# Patient Record
Sex: Male | Born: 1950 | Race: White | Hispanic: No | Marital: Married | Smoking: Former smoker
Health system: Southern US, Community
[De-identification: ages and names within clinical notes are randomized; demographics above are authoritative.]

## PROBLEM LIST (undated history)

## (undated) DIAGNOSIS — N289 Disorder of kidney and ureter, unspecified: Secondary | ICD-10-CM

## (undated) DIAGNOSIS — I82409 Acute embolism and thrombosis of unspecified deep veins of unspecified lower extremity: Secondary | ICD-10-CM

## (undated) DIAGNOSIS — I10 Essential (primary) hypertension: Secondary | ICD-10-CM

## (undated) DIAGNOSIS — C61 Malignant neoplasm of prostate: Secondary | ICD-10-CM

## (undated) DIAGNOSIS — E785 Hyperlipidemia, unspecified: Secondary | ICD-10-CM

## (undated) DIAGNOSIS — M4800 Spinal stenosis, site unspecified: Secondary | ICD-10-CM

## (undated) HISTORY — DX: Essential (primary) hypertension: I10

## (undated) HISTORY — PX: RENAL BIOPSY: SHX156

## (undated) HISTORY — DX: Hyperlipidemia, unspecified: E78.5

## (undated) HISTORY — DX: Acute embolism and thrombosis of unspecified deep veins of unspecified lower extremity: I82.409

## (undated) HISTORY — PX: TONSILLECTOMY: SUR1361

## (undated) HISTORY — DX: Disorder of kidney and ureter, unspecified: N28.9

## (undated) HISTORY — DX: Spinal stenosis, site unspecified: M48.00

---
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
## 2005-05-27 ENCOUNTER — Encounter: Admission: RE | Admit: 2005-05-27 | Discharge: 2005-05-27 | Payer: Self-pay | Admitting: Nephrology

## 2005-05-29 ENCOUNTER — Ambulatory Visit (HOSPITAL_COMMUNITY): Admission: RE | Admit: 2005-05-29 | Discharge: 2005-05-30 | Payer: Self-pay | Admitting: Nephrology

## 2006-07-28 ENCOUNTER — Ambulatory Visit: Payer: Self-pay | Admitting: Family Medicine

## 2006-08-08 ENCOUNTER — Emergency Department (HOSPITAL_COMMUNITY): Admission: EM | Admit: 2006-08-08 | Discharge: 2006-08-08 | Payer: Self-pay | Admitting: Emergency Medicine

## 2006-08-11 ENCOUNTER — Ambulatory Visit: Payer: Self-pay | Admitting: Family Medicine

## 2006-09-22 ENCOUNTER — Ambulatory Visit: Payer: Self-pay | Admitting: Pulmonary Disease

## 2006-10-01 ENCOUNTER — Ambulatory Visit (HOSPITAL_COMMUNITY): Admission: RE | Admit: 2006-10-01 | Discharge: 2006-10-01 | Payer: Self-pay | Admitting: Pulmonary Disease

## 2006-10-20 ENCOUNTER — Ambulatory Visit: Payer: Self-pay | Admitting: Pulmonary Disease

## 2006-11-26 ENCOUNTER — Ambulatory Visit: Payer: Self-pay | Admitting: Pulmonary Disease

## 2007-01-25 ENCOUNTER — Ambulatory Visit: Payer: Self-pay | Admitting: Family Medicine

## 2007-01-25 DIAGNOSIS — I129 Hypertensive chronic kidney disease with stage 1 through stage 4 chronic kidney disease, or unspecified chronic kidney disease: Secondary | ICD-10-CM | POA: Insufficient documentation

## 2007-01-25 DIAGNOSIS — E785 Hyperlipidemia, unspecified: Secondary | ICD-10-CM | POA: Insufficient documentation

## 2007-01-25 DIAGNOSIS — I10 Essential (primary) hypertension: Secondary | ICD-10-CM | POA: Insufficient documentation

## 2007-01-25 DIAGNOSIS — J45909 Unspecified asthma, uncomplicated: Secondary | ICD-10-CM | POA: Insufficient documentation

## 2007-01-25 DIAGNOSIS — J019 Acute sinusitis, unspecified: Secondary | ICD-10-CM | POA: Insufficient documentation

## 2007-02-28 ENCOUNTER — Encounter (INDEPENDENT_AMBULATORY_CARE_PROVIDER_SITE_OTHER): Payer: Self-pay | Admitting: Family Medicine

## 2007-10-10 ENCOUNTER — Encounter (INDEPENDENT_AMBULATORY_CARE_PROVIDER_SITE_OTHER): Payer: Self-pay | Admitting: Family Medicine

## 2008-05-31 ENCOUNTER — Ambulatory Visit: Payer: Self-pay | Admitting: Pulmonary Disease

## 2008-10-10 ENCOUNTER — Telehealth (INDEPENDENT_AMBULATORY_CARE_PROVIDER_SITE_OTHER): Payer: Self-pay | Admitting: *Deleted

## 2009-02-13 ENCOUNTER — Encounter: Payer: Self-pay | Admitting: Internal Medicine

## 2009-02-15 ENCOUNTER — Encounter: Payer: Self-pay | Admitting: Internal Medicine

## 2009-03-06 ENCOUNTER — Telehealth: Payer: Self-pay | Admitting: Family Medicine

## 2009-08-13 ENCOUNTER — Ambulatory Visit: Payer: Self-pay | Admitting: Pulmonary Disease

## 2010-08-06 ENCOUNTER — Encounter: Payer: Self-pay | Admitting: Internal Medicine

## 2010-09-02 ENCOUNTER — Encounter: Payer: Self-pay | Admitting: Internal Medicine

## 2010-09-12 ENCOUNTER — Telehealth: Payer: Self-pay | Admitting: Internal Medicine

## 2010-09-25 NOTE — Progress Notes (Signed)
Summary: Needs to establish with Hop and Declined  Phone Note Outgoing Call   Summary of Call: MD has been receiving records on the patient and has not seen him. Note from 2010 states that the patient said he will be seeing [facility]. Patient needs appt to est/CPX. Left message on machine to call back to office. Lucious Groves CMA  September 12, 2010 11:17 AM   No return call, so I called the patient again. I spoke with him and patient declined appt at this time. Lucious Groves CMA  September 16, 2010 3:35 PM   Follow-up for Phone Call        noted Follow-up by: Marga Melnick MD,  September 16, 2010 4:18 PM

## 2010-10-01 NOTE — Letter (Signed)
Summary: [facility]  [facility]   Imported By: Lanelle Bal 09/22/2010 12:41:32  _____________________________________________________________________  External Attachment:    Type:   Image     Comment:   External Document

## 2011-01-09 NOTE — Assessment & Plan Note (Signed)
[facility]                             PULMONARY OFFICE NOTE   IGOR, BISHOP                     MRN:          [id number]  DATE:10/20/2006                            DOB:          [birth year]    SUBJECTIVE:  Mr. Tomasello comes in today for followup of his recent  methacholine challenge test.  This showed a definite hyperreactive  airway response to methacholine with significant improvement after beta-  2 agonist administration.  The patient has been using albuterol a lot  lately with increasing cough and also wheezing; also increased shortness  of breath.   PHYSICAL EXAMINATION:  VITAL SIGNS:  BP is 112/82, pulse is 70,  temperature is 98.1, weight is 178 pounds, O2 saturation on room air is  94%.  GENERAL:  He is a well-developed male in no acute distress.  CHEST:  Reveals decreased breath sounds throughout.  CARDIAC:  Reveals regular rate and rhythm.   IMPRESSION:  Positive methacholine challenge test with symptoms  consistent with mild asthma exacerbation.  At this point in time I think  he needs to have a prednisone taper to decrease airway inflammation and  also needs to be on maintenance inhaled corticosteroids on a regular  basis.  I have had a long discussion with him about the inflammatory  response of asthma and how medication needs to be taken on a daily basis  lifelong.  The patient has voiced understanding of this.   PLAN:  1. Will place on prednisone taper over the next 6 days.  2. Asmanex two puffs q.h.s.  The patient can also use albumin p.r.n.      but I have told him albuterol used more than two times a week is      considered a red flag for poor asthma control.  3. Tussionex 4 ounces 5mL q.12h. p.r.n. for his cough until the      steroids kick in to decrease airway inflammation.  If the patient      continues to have a cough, I think we need to consider whether or      not Lotrel is contributing to this.  4. The patient  will follow up in 4 weeks, sooner if there are      problems.     Barbaraann Share, MD,FCCP  Electronically Signed    [facility]  DD: 10/28/2006  DT: 10/28/2006  Job #: [id number]   cc:   Terrial Rhodes, M.D.

## 2011-01-09 NOTE — Assessment & Plan Note (Signed)
[facility]                             PULMONARY OFFICE NOTE   TERRYON, PINEIRO                     MRN:          [id number]  DATE:09/22/2006                            DOB:          February 10, 1951    HISTORY OF PRESENT ILLNESS:  The patient is a 60 year old gentleman who  is being referred today for shortness of breath and wheezing.  The  patient states that in November/December of this year he began to have  spells of wheezing at night.  This kept him from sleeping at times.  He was seen by his primary care doctor and given various medications,  including albuterol and prednisone.  He felt a little bit better after  this.  On December 16 he had a very bad episode where he had to go to  the emergency room and he received prednisone by mouth as well as IV and  he felt much better with this.  The patient has not had any prednisone  since the first week of January, but has used the albuterol about 3  times over the last 2 weeks.  He was also placed on Flovent and has not  been taken this.  Overall he feels that he is somewhat better.  He notes  shortness of breath with mild to moderate exertion as well as a cough  with no mucous production.  The patient has never had a allergy  evaluation and has no history of childhood asthma or family history for  asthma.  The patient describes the spells as a feeling in his chest  that it is closely down, and does not believe that it is in his throat  area.  The patient does have a history of being on Lotrel for years and  had increasing dose approximately one year ago.   PAST MEDICAL HISTORY:  1. Chronic renal insufficiency/nephropathy.  2. History of hypertension.   CURRENT MEDICATIONS:  1. Lotrel 10/40 one daily.  2. Lovaza 1 gram 3 daily.  3. Simvastatin 40 mg daily.  4. Hydrochlorothiazide 25 mg daily.  5. Flovent 44 p.r.n.  6. Albuterol HFA p.r.n.   ALLERGIES:  No known drug allergies.   SOCIAL  HISTORY:  The patient works in Air[employer] and is married.  He has a  history of smoking one half pack per day for 15 years.  He has smoked on  and off since high school.  He has been quit since 3 years ago.   FAMILY HISTORY:  Unremarkable.   REVIEW OF SYSTEMS:  As per history of present illness.  Also see patient  intake form documented in chart.   PHYSICAL EXAMINATION:  GENERAL:  He is a well-developed male in no acute  distress.  Blood pressure is 118/74, pulse is 67, temperature is 98, weight 179  pounds, O2 saturation on room air is 95%.  HEENT:  Pupils equal, round and reactive to light and accommodation.  Extraocular muscles are intact.  Nares are patent without discharge.  Oropharynx is clear.  NECK:  Supple without JVD or lymphadenopathy.  There is no palpable  thyromegaly.  CHEST:  Totally clear to auscultation.  CARDIAC:  Regular rate and rhythm.  No murmurs, rubs or gallops.  ABDOMEN:  Soft, nontender with good bowel sounds.  GENITAL, RECTAL, BREAST:  Not done and not indicated.  LOWER EXTREMITIES:  Without edema.  Pulses are intact distally.  NEUROLOGIC:  Alert and oriented with no obvious motor deficits.   LABORATORY DATA:  Spirometry done today in the office shows no airflow  obstruction by FEV1 or FVC; however, his flow volume loop does have mild  coving that may be suggestive of occult airflow obstruction.   IMPRESSION:  Episodic shortness of breath with wheezing of unknown  etiology.  Although the patient does not have definite airflow  obstruction on his pulmonary function tests, his flow volume loop may  suggest this and his history is somewhat suspicious for this.  I really  think that at this point in time that we need to put the issue to rest  and have him undergo methacholine challenge.  The patient is a  _agreeable_________ approach.   PLAN:  1. Schedule for methacholine challenge test.  2. The patient will follow up after the above.     Barbaraann Share,  MD,FCCP  Electronically Signed    [facility]  DD: 10/18/2006  DT: 10/19/2006  Job #: [id number]   cc:   Lottie Rater, M.D.

## 2011-04-15 ENCOUNTER — Encounter: Payer: Self-pay | Admitting: Internal Medicine

## 2011-05-04 ENCOUNTER — Ambulatory Visit: Payer: Self-pay | Admitting: Internal Medicine

## 2011-05-25 ENCOUNTER — Ambulatory Visit (INDEPENDENT_AMBULATORY_CARE_PROVIDER_SITE_OTHER): Payer: BC Managed Care – PPO | Admitting: Internal Medicine

## 2011-05-25 ENCOUNTER — Encounter: Payer: Self-pay | Admitting: Internal Medicine

## 2011-05-25 VITALS — BP 124/80 | HR 80 | Ht 70.0 in | Wt 191.0 lb

## 2011-05-25 DIAGNOSIS — R195 Other fecal abnormalities: Secondary | ICD-10-CM

## 2011-05-25 NOTE — Patient Instructions (Signed)
Please begin Benefiber daily for 1 month.  Follow up in 4 weeks.

## 2011-05-25 NOTE — Progress Notes (Signed)
Subjective:    Patient ID: Shawn Cooley, male    DOB: 1950/10/19, 60 y.o.   MRN: [id number]  HPI MR. 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 Arrie Aran for evaluation of loose stools/diarrhea.  The patient reports 2-3 months of loose stools. This started somewhat abruptly and represents a change for him. Prior to this he was having one formed stool daily. For the last 2 months he reports "runny" or loose stools one to 2 times a day. Occasionally there has been urgency associated with these loose stools. Overall for the past one to 2 weeks he feels he is somewhat better. He continues to have mainly one to 2 loose stools daily. He does report however sometimes his stools are formed. He feels that his symptoms seem to improve whenever he is out of panel, and therefore wonders if some environmental factors playing a role. He has had no change in diet, and no specific change in medications other than switching from simvastatin to atorvastatin. He denies abdominal pain, weight loss, nausea or vomiting. He reports a good appetite. He's had no rectal bleeding, hematochezia, or melena.  He reports having a "completely normal" screening colonoscopy a proximally 6 years ago. I do not have a report of this procedure today. She remembers being advised to have a followup screening exam in 10 years   Review of Systems Constitutional: Negative for fever, chills, night sweats, activity change, appetite change and unexpected weight change HEENT: Negative for sore throat, mouth sores and trouble swallowing. Eyes: Negative for visual disturbance Respiratory: Negative for cough, chest tightness and shortness of breath Cardiovascular: Negative for chest pain, palpitations and lower extremity swelling Gastrointestinal: See history of present illness Genitourinary:  Negative for dysuria, positive for occasional hematuria Musculoskeletal: Negative for back pain, arthralgias and myalgias Skin: Negative for rash or color change Neurological: Negative for headaches, weakness, numbness Hematological: Negative for adenopathy, negative for easy bruising/bleeding Psychiatric/behavioral: Negative for depressed mood, negative for anxiety   Past Medical History  Diagnosis Date  . Hypertension   . Hyperlipidemia   . Asthma   . Spinal stenosis     C5-6 HNP  . Kidney disease    Current Outpatient Prescriptions  Medication Sig Dispense Refill  . albuterol (PROVENTIL HFA;VENTOLIN HFA) 108 (90 BASE) MCG/ACT inhaler Inhale 2 puffs into the lungs every 6 (six) hours as needed.        Marland Kitchen amLODipine-benazepril (LOTREL) 10-40 MG per capsule Take 1 capsule by mouth daily.        Marland Kitchen atorvastatin (LIPITOR) 10 MG tablet Take 10 mg by mouth daily.        . hydrochlorothiazide (HYDRODIURIL) 25 MG tablet Take 25 mg by mouth daily.        . mometasone (ASMANEX) 220 MCG/INH inhaler Inhale 2 puffs into the lungs daily.        Marland Kitchen omega-3 acid ethyl esters (LOVAZA) 1 G capsule Take 2 g by mouth 3 (three) times daily.         No Known Allergies Family History  Problem Relation Age of Onset  . Stroke Paternal Aunt   . Stroke Paternal Uncle   . Cancer Paternal Aunt     ?  Marland Kitchen Hypertension Mother   -negative for CRC or GI cancer   Social History    Spouse Name: N/A    Number of  Children: 2   Occupational History  . Sales    Social History Main Topics  . Smoking status: Former Smoker    Types: Cigarettes    Quit date: 08/24/2006, though very rarely still smokes an occasional cigarette   . Smokeless tobacco: Never Used  . Alcohol Use: Yes     1 beer per day  . Drug Use: No      Objective:   Physical Exam BP 124/80  Pulse 80  Ht 5\' 10"  (1.778 m)  Wt 191 lb (86.637 kg)  BMI 27.41 kg/m2 Constitutional: Well-developed and well-nourished. No distress. HEENT:  Normocephalic and atraumatic. Oropharynx is clear and moist. No oropharyngeal exudate. Conjunctivae are normal. Pupils are equal round and reactive to light. No scleral icterus. Neck: Neck supple. Trachea midline. Cardiovascular: Normal rate, regular rhythm and intact distal pulses. No M/R/G Pulmonary/chest: Effort normal and breath sounds normal. No wheezing, rales or rhonchi. Abdominal: Soft, nontender, nondistended. Bowel sounds active throughout. There are no masses palpable. No hepatosplenomegaly. Lymphadenopathy: No cervical adenopathy noted. Neurological: Alert and oriented to person place and time. Skin: Skin is warm and dry. No rashes noted. Psychiatric: Normal mood and affect. Behavior is normal.  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 Arrie Aran for evaluation of loose stools/diarrhea.  1. Loose stools -- at present the patient's loose stools seem to be overall improved and somewhat intermittent. I do not think this meets the diagnostic criteria for diarrhea. He has no alarm symptoms and is nontoxic. It is interesting that his symptoms go away completely when he is out of town though there is no environmental trigger obvious at present. He is not on well water and has no lifestyle changes that I can determine today. I would like to get a copy of his colonoscopy to assure the preparation was adequate and the exam complete. If it indeed was, then I do not think a colonoscopy is warranted at present. I would like to give him a trial of  Benefiber, in an attempt to bulk and help form his stools. If this intervention is unsuccessful then we likely will proceed with repeat colonoscopy with plans for biopsy. I will see him back in 4 weeks' time to reassess symptoms, and in the interim request records from his screening colonoscopy. I've advised that should he develop worsening symptoms, including rectal bleeding to notify my office immediately.

## 2011-05-28 ENCOUNTER — Telehealth: Payer: Self-pay

## 2011-05-28 NOTE — Telephone Encounter (Signed)
Message copied by Donata Duff on Thu May 28, 2011  8:30 AM ------      Message from: Beverley Fiedler      Created: Thu May 28, 2011  8:15 AM       Clydene Pugh.      Thanks for trying.      ----- Message -----         From: Chales Abrahams, CMA         Sent: 05/27/2011   9:01 AM           To: Erick Blinks, MD            Dr Rhea Belton I have sent a release to several Dr's and none of them have any records for the pt.  I have tried to reach the pt to see if her remembers any where else the procedures may have been done but he does not answer and has no voice mail.

## 2011-05-28 NOTE — Telephone Encounter (Signed)
I will wait on pt response

## 2011-06-22 ENCOUNTER — Encounter: Payer: Self-pay | Admitting: Internal Medicine

## 2011-06-23 ENCOUNTER — Encounter: Payer: Self-pay | Admitting: Internal Medicine

## 2011-06-26 ENCOUNTER — Ambulatory Visit: Payer: BC Managed Care – PPO | Admitting: Internal Medicine

## 2014-01-31 LAB — HEPATIC FUNCTION PANEL
ALT: 25 U/L (ref 10–40)
AST: 22 U/L (ref 14–40)
Alkaline Phosphatase: 53 U/L (ref 25–125)
Bilirubin, Total: 0.2 mg/dL

## 2014-01-31 LAB — LIPID PANEL
Cholesterol: 176 mg/dL (ref 0–200)
HDL: 60 mg/dL (ref 35–70)
LDL Cholesterol: 101 mg/dL
LDl/HDL Ratio: 1.7
Triglycerides: 77 mg/dL (ref 40–160)

## 2014-01-31 LAB — BASIC METABOLIC PANEL
BUN: 21 mg/dL (ref 4–21)
Creatinine: 1.3 mg/dL (ref 0.6–1.3)
Glucose: 102 mg/dL
Potassium: 4 mmol/L (ref 3.4–5.3)
Sodium: 143 mmol/L (ref 137–147)

## 2014-09-21 LAB — LIPID PANEL
Cholesterol: 174 mg/dL (ref 0–200)
HDL: 52 mg/dL (ref 35–70)
LDL Cholesterol: 98 mg/dL
Triglycerides: 120 mg/dL (ref 40–160)

## 2014-09-21 LAB — HEPATIC FUNCTION PANEL
ALT: 23 U/L (ref 10–40)
AST: 18 U/L (ref 14–40)
Alkaline Phosphatase: 52 U/L (ref 25–125)
Bilirubin, Total: 0.2 mg/dL

## 2014-09-21 LAB — BASIC METABOLIC PANEL
BUN: 23 mg/dL — AB (ref 4–21)
Creatinine: 1.3 mg/dL (ref 0.6–1.3)
Glucose: 93 mg/dL
Potassium: 4 mmol/L (ref 3.4–5.3)
Sodium: 140 mmol/L (ref 137–147)

## 2014-09-21 LAB — CBC AND DIFFERENTIAL
HCT: 39 % — AB (ref 41–53)
Hemoglobin: 13.5 g/dL (ref 13.5–17.5)
Neutrophils Absolute: 4 /uL
Platelets: 248 10*3/uL (ref 150–399)
WBC: 8.4 10^3/mL

## 2014-09-21 LAB — PSA: PSA: 2.5

## 2015-05-02 LAB — HEPATIC FUNCTION PANEL
ALT: 25 U/L (ref 10–40)
AST: 21 U/L (ref 14–40)
Alkaline Phosphatase: 55 U/L (ref 25–125)
Bilirubin, Total: 0.5 mg/dL

## 2015-05-02 LAB — CBC AND DIFFERENTIAL
HCT: 42 % (ref 41–53)
Hemoglobin: 14.7 g/dL (ref 13.5–17.5)
Neutrophils Absolute: 4 /uL
Platelets: 247 10*3/uL (ref 150–399)
WBC: 7.4 10^3/mL

## 2015-05-02 LAB — BASIC METABOLIC PANEL
BUN: 20 mg/dL (ref 4–21)
Creatinine: 1.4 mg/dL — AB (ref 0.6–1.3)
Glucose: 98 mg/dL
Potassium: 3.8 mmol/L (ref 3.4–5.3)
Sodium: 141 mmol/L (ref 137–147)

## 2015-05-02 LAB — LIPID PANEL
Cholesterol: 162 mg/dL (ref 0–200)
HDL: 59 mg/dL (ref 35–70)
LDL Cholesterol: 75 mg/dL
Triglycerides: 138 mg/dL (ref 40–160)

## 2015-05-09 ENCOUNTER — Telehealth: Payer: Self-pay | Admitting: General Practice

## 2015-05-09 NOTE — Telephone Encounter (Signed)
Relation to pt: self  Call back [phone]  Reason for call:  Patient was referred by Dr. Donato Heinz, MD [facility] [address], [place], [place] [address] to establish as a new patient.  Please advise

## 2015-05-10 NOTE — Telephone Encounter (Signed)
That is ok, schedule an appointment

## 2015-05-10 NOTE — Telephone Encounter (Signed)
LVM awaiting call back to schedule

## 2015-08-20 ENCOUNTER — Telehealth: Payer: Self-pay

## 2015-08-21 ENCOUNTER — Ambulatory Visit: Payer: Self-pay | Admitting: Internal Medicine

## 2015-08-21 NOTE — Telephone Encounter (Signed)
Pre Visit call completed. 

## 2015-08-29 ENCOUNTER — Encounter: Payer: Self-pay | Admitting: Internal Medicine

## 2015-08-29 ENCOUNTER — Ambulatory Visit (INDEPENDENT_AMBULATORY_CARE_PROVIDER_SITE_OTHER): Payer: BLUE CROSS/BLUE SHIELD | Admitting: Internal Medicine

## 2015-08-29 ENCOUNTER — Telehealth: Payer: Self-pay

## 2015-08-29 VITALS — BP 116/72 | HR 82 | Temp 98.0°F | Ht 70.0 in | Wt 196.4 lb

## 2015-08-29 DIAGNOSIS — N183 Chronic kidney disease, stage 3 unspecified: Secondary | ICD-10-CM

## 2015-08-29 DIAGNOSIS — N1832 Chronic kidney disease, stage 3b: Secondary | ICD-10-CM | POA: Insufficient documentation

## 2015-08-29 DIAGNOSIS — E785 Hyperlipidemia, unspecified: Secondary | ICD-10-CM | POA: Diagnosis not present

## 2015-08-29 DIAGNOSIS — Z23 Encounter for immunization: Secondary | ICD-10-CM | POA: Diagnosis not present

## 2015-08-29 DIAGNOSIS — I10 Essential (primary) hypertension: Secondary | ICD-10-CM

## 2015-08-29 DIAGNOSIS — Z7689 Persons encountering health services in other specified circumstances: Secondary | ICD-10-CM

## 2015-08-29 NOTE — Patient Instructions (Addendum)
BEFORE YOU LEAVE THE OFFICE: GO TO THE FRONT DESK Schedule a complete physical exam to be done in 3 months  Please be fasting     AFTER YOU LEAVE THE OFFICE: Continue taking the medications as prescribed. Take Lipitor, the cholesterol medication:  1 tablet Monday Wednesday and Friday.

## 2015-08-29 NOTE — Telephone Encounter (Signed)
Received fax confirmation on 08/29/2015 at 1028.

## 2015-08-29 NOTE — Progress Notes (Signed)
Pre visit review using our clinic review tool, if applicable. No additional management support is needed unless otherwise documented below in the visit note. 

## 2015-08-29 NOTE — Progress Notes (Signed)
Subjective:    Patient ID: Shawn Cooley, male    DOB: 01-08-1951, 65 y.o.   MRN: [id number]  DOS:  08/29/2015 Type of visit - description : New patient, used to be Dr. Linna Darner Interval history: CKD: The patient has a long history of CKD, sees his nephrologist regularly, he was recommended to establish with a primary care doctor. High cholesterol: Take Lipitor inconsistently, was prescribed Lovaza but is not covered by his insurance BPH currently asymptomatic HTN: Good compliance of medication, no apparent side effects, reports ambulatory BPs are within normal   Review of Systems  Denies chest pain or difficulty breathing. In general feels well.  Past Medical History  Diagnosis Date  . Hypertension   . Hyperlipidemia   . Asthma   . Spinal stenosis     C5-6 HNP  . Kidney disease     Past Surgical History  Procedure Laterality Date  . Tonsillectomy    . Cyst removal trunk  1970    pilonidal  . Renal biopsy      Social History   Social History  . Marital Status: Married    Spouse Name: N/A  . Number of Children: 2  . Years of Education: N/A   Occupational History  . Sales    Social History Main Topics  . Smoking status: Former Smoker    Types: Cigarettes    Quit date: 08/24/2006  . Smokeless tobacco: Never Used  . Alcohol Use: 0.0 oz/week    0 Standard drinks or equivalent per week     Comment: not daily, beer , occ 3-4 /day  . Drug Use: No  . Sexual Activity: Not on file   Other Topics Concern  . Not on file   Social History Narrative   Family History  Problem Relation Age of Onset  . Stroke Paternal Aunt   . Stroke Paternal Uncle   . Cancer Paternal Aunt     ?  Marland Kitchen Hypertension Mother   . Colon cancer Neg Hx   . Prostate cancer Neg Hx   . Diabetes Neg Hx          Medication List       This list is accurate as of: 08/29/15  9:13 PM.  Always use your most recent med list.               albuterol 108 (90 Base) MCG/ACT inhaler    Commonly known as:  PROVENTIL HFA;VENTOLIN HFA  Inhale 2 puffs into the lungs every 6 (six) hours as needed. Reported on 08/29/2015     allopurinol 100 MG tablet  Commonly known as:  ZYLOPRIM  Take 100 mg by mouth daily.     amLODipine-benazepril 5-40 MG capsule  Commonly known as:  LOTREL  Take 1 capsule by mouth daily.     atorvastatin 10 MG tablet  Commonly known as:  LIPITOR  Take 10 mg by mouth daily.     hydrochlorothiazide 25 MG tablet  Commonly known as:  HYDRODIURIL  Take 25 mg by mouth daily.     mometasone 220 MCG/INH inhaler  Commonly known as:  ASMANEX  Inhale 2 puffs into the lungs daily.           Objective:   Physical Exam BP 116/72 mmHg  Pulse 82  Temp(Src) 98 F (36.7 C) (Oral)  Ht 5\' 10"  (1.778 m)  Wt 196 lb 6 oz (89.075 kg)  BMI 28.18 kg/m2  SpO2 97% General:   Well developed,  well nourished . NAD.  HEENT:  Normocephalic . Face symmetric, atraumatic Lungs:  CTA B Normal respiratory effort, no intercostal retractions, no accessory muscle use. Heart: RRR,  no murmur.  no pretibial edema bilaterally  Abdomen:  Not distended, soft, non-tender. No rebound or rigidity.   Skin: Not pale. Not jaundice Neurologic:  alert & oriented X3.  Speech normal, gait appropriate for age and unassisted Psych--  Cognition and judgment appear intact.  Cooperative with normal attention span and concentration.  Behavior appropriate. No anxious or depressed appearing.    Assessment & Plan:   Assessment CKD, dx ~2005 d/t IgA nephropathy HTN dx 1990s Hyperlipidemia Gout   Asthma H/o BPH  PLAN CKD: will get records from nephrology HTN: Seems to be well-controlled, no change Hyperlipidemia: Unable to afford Lovaza, takes Lipitor inconsistently. Recommend to take Lipitor Monday Wednesday and Friday, will check a FLP when he comes back fasting in 3 months. Asthma: on Asmanex, has not use albuterol in long time. Gout: on Allopurinol, no episodes in  years. Primary care: Reports a colonoscopy remotely, no records. Had a flu shot Pneumovax today RTC 3 months, CPX.

## 2015-08-29 NOTE — Telephone Encounter (Signed)
ROI completed and faxed to [facility] at [phone]. ROI sent for scanning into Pt's chart.

## 2015-08-30 NOTE — Telephone Encounter (Signed)
Records received and forwarded to Dr. Paz. JG//CMA 

## 2015-09-01 NOTE — Telephone Encounter (Signed)
Notes from nephrology reviewed, CKD stage III, biopsy-proven IgA nephropathy, creatinine ranges from 1.2, 1.4. Labs 09/21/2014 Cholesterol 174, triglycerides 120, HDL 52, LDL 98 Creatinine 1.27. Potassium 4.0. AST ALT normal Hemoglobin 13.5, WBCs 8.4, platelets 240 UA + blood, RBCs 3, 10. + Casts. PSA 2.5 Labs 9-8- 2016: Cholesterol 162, triglycerides 138, HDL 59, LDL 75 Creatinine 1.39. Potassium 3.8. AST, ALT normal. Calcium 10.5 slightly elevated Hemoglobin 14.7, platelets 247, WBC 7.4 ua-- rbc's 11, 30, + granular casts, few bacteria

## 2015-09-02 ENCOUNTER — Encounter: Payer: Self-pay | Admitting: Internal Medicine

## 2016-01-29 ENCOUNTER — Ambulatory Visit (INDEPENDENT_AMBULATORY_CARE_PROVIDER_SITE_OTHER): Payer: BLUE CROSS/BLUE SHIELD | Admitting: Internal Medicine

## 2016-01-29 ENCOUNTER — Encounter: Payer: Self-pay | Admitting: Internal Medicine

## 2016-01-29 VITALS — BP 118/74 | HR 58 | Temp 97.8°F | Ht 70.0 in | Wt 185.5 lb

## 2016-01-29 DIAGNOSIS — Z0000 Encounter for general adult medical examination without abnormal findings: Secondary | ICD-10-CM | POA: Diagnosis not present

## 2016-01-29 DIAGNOSIS — Z23 Encounter for immunization: Secondary | ICD-10-CM

## 2016-01-29 DIAGNOSIS — Z114 Encounter for screening for human immunodeficiency virus [HIV]: Secondary | ICD-10-CM

## 2016-01-29 DIAGNOSIS — Z125 Encounter for screening for malignant neoplasm of prostate: Secondary | ICD-10-CM | POA: Diagnosis not present

## 2016-01-29 DIAGNOSIS — Z09 Encounter for follow-up examination after completed treatment for conditions other than malignant neoplasm: Secondary | ICD-10-CM | POA: Insufficient documentation

## 2016-01-29 LAB — COMPREHENSIVE METABOLIC PANEL
ALT: 22 U/L (ref 0–53)
AST: 22 U/L (ref 0–37)
Albumin: 3.8 g/dL (ref 3.5–5.2)
Alkaline Phosphatase: 47 U/L (ref 39–117)
BUN: 34 mg/dL — ABNORMAL HIGH (ref 6–23)
CO2: 34 mEq/L — ABNORMAL HIGH (ref 19–32)
Calcium: 10.3 mg/dL (ref 8.4–10.5)
Chloride: 107 mEq/L (ref 96–112)
Creatinine, Ser: 1.6 mg/dL — ABNORMAL HIGH (ref 0.40–1.50)
GFR: 46.69 mL/min — ABNORMAL LOW (ref >60.00)
Glucose, Bld: 93 mg/dL (ref 70–99)
Potassium: 4.9 mEq/L (ref 3.5–5.1)
Sodium: 146 mEq/L — ABNORMAL HIGH (ref 135–145)
Total Bilirubin: 0.5 mg/dL (ref 0.2–1.2)
Total Protein: 5.8 g/dL — ABNORMAL LOW (ref 6.0–8.3)

## 2016-01-29 LAB — TSH: TSH: 2.62 u[IU]/mL (ref 0.35–4.50)

## 2016-01-29 LAB — PSA: PSA: 2.72 ng/mL (ref 0.10–4.00)

## 2016-01-29 NOTE — Patient Instructions (Signed)
GO TO THE LAB : Get the blood work     GO TO THE FRONT DESK Schedule your next appointment for a  routine checkup in 6 months  

## 2016-01-29 NOTE — Progress Notes (Signed)
Subjective:    Patient ID: Shawn Cooley, male    DOB: 07-20-1951, 65 y.o.   MRN: [id number]  DOS:  01/29/2016 Type of visit - description : CPX Interval history: Doing ok, see review of systems  Review of Systems  In general feeling well. Has noted some weight loss lately, reports a change in his eating habits, not eating fast food, his appetite has decreased a little and consequently he is eating less amount of food. Denies red flag symptoms such as depression, stomach pain etc. See below About 10 days ago he injured his left hand, he blocked a ball with his palm, some pain w/o swelling at base of thumb near the wrsit  Constitutional: No fever. No chills. no unusual sweats  HEENT: No dental problems, no ear discharge, no facial swelling, no voice changes. No eye discharge, no eye  redness , no  intolerance to light   Respiratory: No wheezing , no  difficulty breathing. No cough , no mucus production  Cardiovascular: No CP, no leg swelling , no  Palpitations  GI: no nausea, no vomiting, no diarrhea , no  abdominal pain.  No blood in the stools. No dysphagia, no odynophagia    Endocrine: No polyphagia, no polyuria , no polydipsia  GU: No dysuria, gross hematuria, difficulty urinating. No urinary urgency, no frequency.occ nocturia Musculoskeletal: see above  Skin: No change in the color of the skin, palor , no  Rash  Allergic, immunologic: No environmental allergies , no  food allergies  Neurological: No dizziness no  syncope. No headaches. No diplopia, no slurred, no slurred speech, no motor deficits, no facial  Numbness  Hematological: No enlarged lymph nodes, no easy bruising , no unusual bleedings  Psychiatry: No suicidal ideas, no hallucinations, no beavior problems, no confusion.  No unusual/severe anxiety, no depression    Past Medical History  Diagnosis Date  . Hypertension   . Hyperlipidemia   . Asthma   . Spinal stenosis     C5-6 HNP  . Kidney disease       Stage 3    Past Surgical History  Procedure Laterality Date  . Tonsillectomy    . Cyst removal trunk  1970    pilonidal  . Renal biopsy      Social History   Social History  . Marital Status: Married    Spouse Name: N/A  . Number of Children: 2  . Years of Education: N/A   Occupational History  . Sales    Social History Main Topics  . Smoking status: Former Smoker    Types: Cigarettes    Quit date: 08/24/2006  . Smokeless tobacco: Never Used  . Alcohol Use: 0.0 oz/week    0 Standard drinks or equivalent per week     Comment: not daily, beer , occ 3-4 /day  . Drug Use: No  . Sexual Activity: Not on file   Other Topics Concern  . Not on file   Social History Narrative   Lives w/ wife   2 independent children      Family History  Problem Relation Age of Onset  . Stroke Paternal Aunt   . Stroke Paternal Uncle   . Cancer Paternal Aunt     ?  Marland Kitchen Hypertension Mother   . Colon cancer Neg Hx   . Prostate cancer Neg Hx   . Diabetes Neg Hx        Medication List       This list  is accurate as of: 01/29/16  6:32 PM.  Always use your most recent med list.               albuterol 108 (90 Base) MCG/ACT inhaler  Commonly known as:  PROVENTIL HFA;VENTOLIN HFA  Inhale 2 puffs into the lungs every 6 (six) hours as needed. Reported on 01/29/2016     allopurinol 100 MG tablet  Commonly known as:  ZYLOPRIM  Take 100 mg by mouth daily.     amLODipine-benazepril 5-40 MG capsule  Commonly known as:  LOTREL  Take 1 capsule by mouth daily.     atorvastatin 10 MG tablet  Commonly known as:  LIPITOR  Take 10 mg by mouth daily.     hydrochlorothiazide 25 MG tablet  Commonly known as:  HYDRODIURIL  Take 25 mg by mouth daily.     mometasone 220 MCG/INH inhaler  Commonly known as:  ASMANEX  Inhale 2 puffs into the lungs daily. Reported on 01/29/2016           Objective:   Physical Exam BP 118/74 mmHg  Pulse 58  Temp(Src) 97.8 F (36.6 C) (Oral)  Ht 5\' 10"   (1.778 m)  Wt 185 lb 8 oz (84.142 kg)  BMI 26.62 kg/m2  SpO2 98%  General:   Well developed, well nourished . NAD.  Neck: No  thyromegaly  HEENT:  Normocephalic . Face symmetric, atraumatic Lungs:  CTA B Normal respiratory effort, no intercostal retractions, no accessory muscle use. Heart: RRR,  no murmur.  No pretibial edema bilaterally  Abdomen:  Not distended, soft, non-tender. No rebound or rigidity.   Rectal:  External abnormalities: none. Normal sphincter tone. No rectal masses or tenderness.  No stool    Prostate: Prostate gland firm and smooth, no enlargement, nodularity, tenderness, mass, asymmetry or induration.  MSK: Left hand and wrist: No deformities, wnl range of motion, slightly TTP at the base of the thumb near the wrist. Skin: Exposed areas without rash. Not pale. Not jaundice Neurologic:  alert & oriented X3.  Speech normal, gait appropriate for age and unassisted Strength symmetric and appropriate for age.  Psych: Cognition and judgment appear intact.  Cooperative with normal attention span and concentration.  Behavior appropriate. No anxious or depressed appearing.    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 Rx observation Left hand injury: Observation, call if not better for possible referral. RTC 6 months to monitor her weight loss.

## 2016-01-29 NOTE — Assessment & Plan Note (Addendum)
Td- today pnm 23-- 08-2015 prevnar-- in 6 months zostavax discussed  EKG for baseline: Normal sinus rhythm, no previous EKGs CCS- reports had a Cscope ~ age 64, normal. Pro-cons of 3 screening modalities discussed, elected IFOB DRE normal today, check a PSA Diet and exercise: He is doing well. Labs reviewed: due for a CMP, TSH, PSA and HIV.

## 2016-01-29 NOTE — Progress Notes (Signed)
Pre visit review using our clinic review tool, if applicable. No additional management support is needed unless otherwise documented below in the visit note. 

## 2016-01-29 NOTE — Assessment & Plan Note (Signed)
Doing well, chronic medical problems seem controlled Weight loss: Patient has changed his eating habits, some decreased appetite but no depression or alarming symptoms. Rx observation Left hand injury: Observation, call if not better for possible referral. RTC 6 months to monitor her weight loss.

## 2016-01-30 LAB — HIV ANTIBODY (ROUTINE TESTING W REFLEX): HIV 1&2 Ab, 4th Generation: NONREACTIVE

## 2016-04-15 ENCOUNTER — Telehealth: Payer: Self-pay | Admitting: Internal Medicine

## 2016-04-15 DIAGNOSIS — N289 Disorder of kidney and ureter, unspecified: Secondary | ICD-10-CM

## 2016-04-15 NOTE — Telephone Encounter (Signed)
Letter printed and mailed to Pt informing him to call office to schedule lab appt to recheck kidney functions. BMP ordered.

## 2016-04-15 NOTE — Telephone Encounter (Signed)
Due for a BMP, see last labs. Please arrange

## 2016-06-12 LAB — HEPATIC FUNCTION PANEL
ALT: 21 U/L (ref 10–40)
AST: 18 U/L (ref 14–40)
Alkaline Phosphatase: 56 U/L (ref 25–125)
Bilirubin, Total: 0.3 mg/dL

## 2016-06-12 LAB — CBC AND DIFFERENTIAL
HCT: 43 % (ref 41–53)
Hemoglobin: 14.7 g/dL (ref 13.5–17.5)
Neutrophils Absolute: 4 /uL
Platelets: 270 10*3/uL (ref 150–399)
WBC: 6.5 10^3/mL

## 2016-06-12 LAB — LIPID PANEL
Cholesterol: 188 mg/dL (ref 0–200)
HDL: 61 mg/dL (ref 35–70)
LDL Cholesterol: 104 mg/dL
Triglycerides: 116 mg/dL (ref 40–160)

## 2016-06-12 LAB — BASIC METABOLIC PANEL
BUN: 26 mg/dL — AB (ref 4–21)
Creatinine: 1.5 mg/dL — AB (ref 0.6–1.3)
Glucose: 89 mg/dL
Potassium: 4.7 mmol/L (ref 3.4–5.3)
Sodium: 141 mmol/L (ref 137–147)

## 2016-07-06 ENCOUNTER — Encounter: Payer: Self-pay | Admitting: Internal Medicine

## 2016-07-30 ENCOUNTER — Ambulatory Visit (INDEPENDENT_AMBULATORY_CARE_PROVIDER_SITE_OTHER): Payer: BLUE CROSS/BLUE SHIELD | Admitting: Internal Medicine

## 2016-07-30 ENCOUNTER — Encounter: Payer: Self-pay | Admitting: Internal Medicine

## 2016-07-30 VITALS — BP 122/74 | HR 84 | Temp 97.6°F | Resp 14 | Ht 70.0 in | Wt 195.5 lb

## 2016-07-30 DIAGNOSIS — E785 Hyperlipidemia, unspecified: Secondary | ICD-10-CM | POA: Diagnosis not present

## 2016-07-30 DIAGNOSIS — I10 Essential (primary) hypertension: Secondary | ICD-10-CM | POA: Diagnosis not present

## 2016-07-30 DIAGNOSIS — J45909 Unspecified asthma, uncomplicated: Secondary | ICD-10-CM | POA: Diagnosis not present

## 2016-07-30 MED ORDER — ATORVASTATIN CALCIUM 10 MG PO TABS: 10.0000 mg | ORAL_TABLET | Freq: Every day | ORAL | 5 refills | Status: DC

## 2016-07-30 MED ORDER — AMLODIPINE BESY-BENAZEPRIL HCL 5-40 MG PO CAPS: 1.0000 | ORAL_CAPSULE | Freq: Every day | ORAL | 5 refills | Status: DC

## 2016-07-30 MED ORDER — HYDROCHLOROTHIAZIDE 25 MG PO TABS: 25.0000 mg | ORAL_TABLET | Freq: Every day | ORAL | 5 refills | Status: DC

## 2016-07-30 MED ORDER — MOMETASONE FUROATE 220 MCG/INH IN AEPB: 2.0000 | INHALATION_SPRAY | Freq: Every day | RESPIRATORY_TRACT | 5 refills | Status: AC | PRN

## 2016-07-30 MED ORDER — ALBUTEROL SULFATE HFA 108 (90 BASE) MCG/ACT IN AERS: 2.0000 | INHALATION_SPRAY | Freq: Four times a day (QID) | RESPIRATORY_TRACT | 5 refills | Status: AC | PRN

## 2016-07-30 MED ORDER — ALLOPURINOL 100 MG PO TABS: 100.0000 mg | ORAL_TABLET | Freq: Every day | ORAL | 5 refills | Status: DC

## 2016-07-30 NOTE — Progress Notes (Signed)
Pre visit review using our clinic review tool, if applicable. No additional management support is needed unless otherwise documented below in the visit note. 

## 2016-07-30 NOTE — Progress Notes (Signed)
Subjective:    Patient ID: Shawn Cooley, male    DOB: 14-Sep-1950, 65 y.o.   MRN: [id number]  DOS:  07/30/2016 Type of visit - description : rov Interval history: Asthma, still smoking, using inhalers as prescribed?.  Reports 2 months history of mild cough, sinus congestion, postnasal dripping. Run out of Cholesterol medication, has not been taking it consistently even when he had the medication.  Wt Readings from Last 3 Encounters:  07/30/16 195 lb 8 oz (88.7 kg)  01/29/16 185 lb 8 oz (84.1 kg)  08/29/15 196 lb 6 oz (89.1 kg)     Review of Systems  Denies fever chills Occasionally produces clear or greenish sputum. No hemoptysis Denies chest pain or difficulty breathing.  Past Medical History:  Diagnosis Date  . Asthma   . Hyperlipidemia   . Hypertension   . Kidney disease    Stage 3  . Spinal stenosis    C5-6 HNP    Past Surgical History:  Procedure Laterality Date  . CYST REMOVAL TRUNK  1970   pilonidal  . RENAL BIOPSY    . TONSILLECTOMY      Social History   Social History  . Marital status: Married    Spouse name: N/A  . Number of children: 2  . Years of education: N/A   Occupational History  . Sales    Social History Main Topics  . Smoking status: Current Some Day Smoker    Types: Cigarettes    Last attempt to quit: 08/24/2006  . Smokeless tobacco: Never Used     Comment: still smokes , rarely, used to smoke more   . Alcohol use 0.0 oz/week     Comment: not daily, beer , occ 3-4 /day  . Drug use: No  . Sexual activity: Not on file   Other Topics Concern  . Not on file   Social History Narrative   Lives w/ wife   2 independent children         Medication List       Accurate as of 07/30/16  7:40 PM. Always use your most recent med list.          albuterol 108 (90 Base) MCG/ACT inhaler Commonly known as:  PROVENTIL HFA;VENTOLIN HFA Inhale 2 puffs into the lungs every 6 (six) hours as needed.   allopurinol 100 MG  tablet Commonly known as:  ZYLOPRIM Take 1 tablet (100 mg total) by mouth daily.   amLODipine-benazepril 5-40 MG capsule Commonly known as:  LOTREL Take 1 capsule by mouth daily.   atorvastatin 10 MG tablet Commonly known as:  LIPITOR Take 1 tablet (10 mg total) by mouth daily.   hydrochlorothiazide 25 MG tablet Commonly known as:  HYDRODIURIL Take 1 tablet (25 mg total) by mouth daily.   mometasone 220 MCG/INH inhaler Commonly known as:  ASMANEX Inhale 2 puffs into the lungs daily as needed.          Objective:   Physical Exam BP 122/74 (BP Location: Left Arm, Patient Position: Sitting, Cuff Size: Normal)   Pulse 84   Temp 97.6 F (36.4 C) (Oral)   Resp 14   Ht 5\' 10"  (1.778 m)   Wt 195 lb 8 oz (88.7 kg)   SpO2 97%   BMI 28.05 kg/m  General:   Well developed, well nourished . NAD.  HEENT:  Normocephalic . Face symmetric, atraumatic Lungs:  Very few end expiratory wheezing, prolonged expiratory time. Few rhonchi Normal respiratory effort, no  intercostal retractions, no accessory muscle use. Heart: RRR,  no murmur.  No pretibial edema bilaterally  Skin: Not pale. Not jaundice Neurologic:  alert & oriented X3.  Speech normal, gait appropriate for age and unassisted Psych--  Cognition and judgment appear intact.  Cooperative with normal attention span and concentration.  Behavior appropriate. No anxious or depressed appearing.      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 Continue hydrochlorothiazide and Lotrel. Hyperlipidemia: Not taking Lipitor consistently, encouraged to take daily. Asthma: Not well controlled, request codeine for cough control. Advised that is not  appropriate. Recommend the following: Quit tobacco, Asmanex daily, albuterol as needed, Mucinex, Flonase consistently. Also wife would like him to get a chest x-Cooley to "check the lungs". Guidelines do not rec CXRs, I offered a low dose CT chest for screening purposes, declined but will think about it. Weight loss: Not an issue. RTC 2 months

## 2016-07-30 NOTE — Patient Instructions (Signed)
To decreased cough:  Mometasone 2 puffs every day  Albuterol 2 tabs up to 3 times a day only if needed  Stop smoking  Mucinex DM OTC as needed  Use Flonase 2 sprays on each side of the nose every day  =====  Take your cholesterol medication atorvastatin daily at night   Come back in 2 months, please make an appointment     Steps to Quit Smoking Smoking tobacco can be harmful to your health and can affect almost every organ in your body. Smoking puts you, and those around you, at risk for developing many serious chronic diseases. Quitting smoking is difficult, but it is one of the best things that you can do for your health. It is never too late to quit. What are the benefits of quitting smoking? When you quit smoking, you lower your risk of developing serious diseases and conditions, such as:  Lung cancer or lung disease, such as COPD.  Heart disease.  Stroke.  Heart attack.  Infertility.  Osteoporosis and bone fractures. Additionally, symptoms such as coughing, wheezing, and shortness of breath may get better when you quit. You may also find that you get sick less often because your body is stronger at fighting off colds and infections. If you are pregnant, quitting smoking can help to reduce your chances of having a baby of low birth weight. How do I get ready to quit? When you decide to quit smoking, create a plan to make sure that you are successful. Before you quit:  Pick a date to quit. Set a date within the next two weeks to give you time to prepare.  Write down the reasons why you are quitting. Keep this list in places where you will see it often, such as on your bathroom mirror or in your car or wallet.  Identify the people, places, things, and activities that make you want to smoke (triggers) and avoid them. Make sure to take these actions:  Throw away all cigarettes at home, at work, and in your car.  Throw away smoking accessories, such as Scientist, physiological.  Clean your car and make sure to empty the ashtray.  Clean your home, including curtains and carpets.  Tell your family, friends, and coworkers that you are quitting. Support from your loved ones can make quitting easier.  Talk with your health care provider about your options for quitting smoking.  Find out what treatment options are covered by your health insurance. What strategies can I use to quit smoking? Talk with your healthcare provider about different strategies to quit smoking. Some strategies include:  Quitting smoking altogether instead of gradually lessening how much you smoke over a period of time. Research shows that quitting "cold [place]" is more successful than gradually quitting.  Attending in-person counseling to help you build problem-solving skills. You are more likely to have success in quitting if you attend several counseling sessions. Even short sessions of 10 minutes can be effective.  Finding resources and support systems that can help you to quit smoking and remain smoke-free after you quit. These resources are most helpful when you use them often. They can include:  Online chats with a Social worker.  Telephone quitlines.  Printed Furniture conservator/restorer.  Support groups or group counseling.  Text messaging programs.  Mobile phone applications.  Taking medicines to help you quit smoking. (If you are pregnant or breastfeeding, talk with your health care provider first.) Some medicines contain nicotine and some do not. Both  types of medicines help with cravings, but the medicines that include nicotine help to relieve withdrawal symptoms. Your health care provider may recommend:  Nicotine patches, gum, or lozenges.  Nicotine inhalers or sprays.  Non-nicotine medicine that is taken by mouth. Talk with your health care provider about combining strategies, such as taking medicines while you are also receiving in-person counseling. Using these two  strategies together makes you more likely to succeed in quitting than if you used either strategy on its own. If you are pregnant or breastfeeding, talk with your health care provider about finding counseling or other support strategies to quit smoking. Do not take medicine to help you quit smoking unless told to do so by your health care provider. What things can I do to make it easier to quit? Quitting smoking might feel overwhelming at first, but there is a lot that you can do to make it easier. Take these important actions:  Reach out to your family and friends and ask that they support and encourage you during this time. Call telephone quitlines, reach out to support groups, or work with a counselor for support.  Ask people who smoke to avoid smoking around you.  Avoid places that trigger you to smoke, such as bars, parties, or smoke-break areas at work.  Spend time around people who do not smoke.  Lessen stress in your life, because stress can be a smoking trigger for some people. To lessen stress, try:  Exercising regularly.  Deep-breathing exercises.  Yoga.  Meditating.  Performing a body scan. This involves closing your eyes, scanning your body from head to toe, and noticing which parts of your body are particularly tense. Purposefully relax the muscles in those areas.  Download or purchase mobile phone or tablet apps (applications) that can help you stick to your quit plan by providing reminders, tips, and encouragement. There are many free apps, such as QuitGuide from the [employer] Office manager for Disease Control and Prevention). You can find other support for quitting smoking (smoking cessation) through smokefree.gov and other websites. How will I feel when I quit smoking? Within the first 24 hours of quitting smoking, you may start to feel some withdrawal symptoms. These symptoms are usually most noticeable 2-3 days after quitting, but they usually do not last beyond 2-3 weeks. Changes  or symptoms that you might experience include:  Mood swings.  Restlessness, anxiety, or irritation.  Difficulty concentrating.  Dizziness.  Strong cravings for sugary foods in addition to nicotine.  Mild weight gain.  Constipation.  Nausea.  Coughing or a sore throat.  Changes in how your medicines work in your body.  A depressed mood.  Difficulty sleeping (insomnia). After the first 2-3 weeks of quitting, you may start to notice more positive results, such as:  Improved sense of smell and taste.  Decreased coughing and sore throat.  Slower heart rate.  Lower blood pressure.  Clearer skin.  The ability to breathe more easily.  Fewer sick days. Quitting smoking is very challenging for most people. Do not get discouraged if you are not successful the first time. Some people need to make many attempts to quit before they achieve long-term success. Do your best to stick to your quit plan, and talk with your health care provider if you have any questions or concerns. This information is not intended to replace advice given to you by your health care provider. Make sure you discuss any questions you have with your health care provider. Document Released: 08/04/2001  Document Revised: 04/07/2016 Document Reviewed: 12/25/2014 Elsevier Interactive Patient Education  2017 [employer].

## 2016-07-30 NOTE — Assessment & Plan Note (Signed)
HTN: Seems well-controlled, last BMP satisfactory. Continue hydrochlorothiazide and Lotrel. Hyperlipidemia: Not taking Lipitor consistently, encouraged to take daily. Asthma: Not well controlled, request codeine for cough control. Advised that is not appropriate. Recommend the following: Quit tobacco, Asmanex daily, albuterol as needed, Mucinex, Flonase consistently. Also wife would like him to get a chest x-ray to "check the lungs". Guidelines do not rec CXRs, I offered a low dose CT chest for screening purposes, declined but will think about it. Weight loss: Not an issue. RTC 2 months

## 2016-09-16 ENCOUNTER — Telehealth: Payer: Self-pay | Admitting: Internal Medicine

## 2016-09-16 NOTE — Telephone Encounter (Signed)
Pt says that he received a 400.00 bill for DOS 01/29/16 that suppose to have been a CPE appt. Pt isnt understanding why he received a bill. Pt says that his insurance is currently looking into it but he cancelled his appt and said that he doesn't want to see provider until he knows that things have been resolved. Pt says that he's not sure if visits are being coded correctly.   Please assist further.

## 2016-09-16 NOTE — Telephone Encounter (Signed)
Followed up with patient regarding bill. Patient stated he has a higher deductible plan but was under the impression that insurance would cover at 100% for preventive. I told patient I would have coding review the visits.

## 2016-09-17 NOTE — Telephone Encounter (Signed)
Left patient a message 1st visit was applied to his deductible and 2nd CPE cover 100% 3rd was a follow up and went to his deductible. Coding is correct on all visits.

## 2016-09-30 DIAGNOSIS — I129 Hypertensive chronic kidney disease with stage 1 through stage 4 chronic kidney disease, or unspecified chronic kidney disease: Secondary | ICD-10-CM | POA: Diagnosis not present

## 2016-09-30 DIAGNOSIS — R809 Proteinuria, unspecified: Secondary | ICD-10-CM | POA: Diagnosis not present

## 2016-09-30 DIAGNOSIS — N028 Recurrent and persistent hematuria with other morphologic changes: Secondary | ICD-10-CM | POA: Diagnosis not present

## 2016-09-30 DIAGNOSIS — N183 Chronic kidney disease, stage 3 (moderate): Secondary | ICD-10-CM | POA: Diagnosis not present

## 2016-09-30 LAB — HEPATIC FUNCTION PANEL
ALK PHOS: 65 U/L (ref 25–125)
ALT: 16 U/L (ref 10–40)
AST: 18 U/L (ref 14–40)
BILIRUBIN, TOTAL: 0.3 mg/dL

## 2016-09-30 LAB — BASIC METABOLIC PANEL
BUN: 25 mg/dL — AB (ref 4–21)
Creatinine: 1.5 mg/dL — AB (ref 0.6–1.3)
GLUCOSE: 87 mg/dL
Potassium: 3.8 mmol/L (ref 3.4–5.3)
Sodium: 142 mmol/L (ref 137–147)

## 2016-10-12 ENCOUNTER — Encounter: Payer: Self-pay | Admitting: Internal Medicine

## 2016-10-16 ENCOUNTER — Ambulatory Visit: Payer: BLUE CROSS/BLUE SHIELD | Admitting: Internal Medicine

## 2016-10-22 DIAGNOSIS — D225 Melanocytic nevi of trunk: Secondary | ICD-10-CM | POA: Diagnosis not present

## 2016-10-22 DIAGNOSIS — L821 Other seborrheic keratosis: Secondary | ICD-10-CM | POA: Diagnosis not present

## 2016-10-22 DIAGNOSIS — L918 Other hypertrophic disorders of the skin: Secondary | ICD-10-CM | POA: Diagnosis not present

## 2016-10-22 DIAGNOSIS — L570 Actinic keratosis: Secondary | ICD-10-CM | POA: Diagnosis not present

## 2016-10-22 DIAGNOSIS — L853 Xerosis cutis: Secondary | ICD-10-CM | POA: Diagnosis not present

## 2017-01-29 DIAGNOSIS — N183 Chronic kidney disease, stage 3 (moderate): Secondary | ICD-10-CM | POA: Diagnosis not present

## 2017-01-29 LAB — BASIC METABOLIC PANEL
BUN: 28 — AB (ref 4–21)
Creatinine: 1.8 — AB (ref 0.6–1.3)
GLUCOSE: 108
Potassium: 4.4 (ref 3.4–5.3)
SODIUM: 144 (ref 137–147)

## 2017-01-29 LAB — HEPATIC FUNCTION PANEL
ALT: 18 (ref 10–40)
AST: 17 (ref 14–40)
Alkaline Phosphatase: 56 (ref 25–125)
BILIRUBIN, TOTAL: 0.2

## 2017-01-29 LAB — CBC AND DIFFERENTIAL
HEMATOCRIT: 41 (ref 41–53)
Hemoglobin: 14.4 (ref 13.5–17.5)
NEUTROS ABS: 4
Platelets: 249 (ref 150–399)
WBC: 6.7

## 2017-02-04 DIAGNOSIS — R809 Proteinuria, unspecified: Secondary | ICD-10-CM | POA: Diagnosis not present

## 2017-02-04 DIAGNOSIS — I129 Hypertensive chronic kidney disease with stage 1 through stage 4 chronic kidney disease, or unspecified chronic kidney disease: Secondary | ICD-10-CM | POA: Diagnosis not present

## 2017-02-04 DIAGNOSIS — N028 Recurrent and persistent hematuria with other morphologic changes: Secondary | ICD-10-CM | POA: Diagnosis not present

## 2017-02-04 DIAGNOSIS — N183 Chronic kidney disease, stage 3 (moderate): Secondary | ICD-10-CM | POA: Diagnosis not present

## 2017-02-13 ENCOUNTER — Other Ambulatory Visit: Payer: Self-pay | Admitting: Internal Medicine

## 2017-02-22 ENCOUNTER — Encounter: Payer: Self-pay | Admitting: Internal Medicine

## 2017-03-11 DIAGNOSIS — M5030 Other cervical disc degeneration, unspecified cervical region: Secondary | ICD-10-CM | POA: Diagnosis not present

## 2017-03-11 DIAGNOSIS — M9901 Segmental and somatic dysfunction of cervical region: Secondary | ICD-10-CM | POA: Diagnosis not present

## 2017-03-11 DIAGNOSIS — M545 Low back pain: Secondary | ICD-10-CM | POA: Diagnosis not present

## 2017-03-11 DIAGNOSIS — M9903 Segmental and somatic dysfunction of lumbar region: Secondary | ICD-10-CM | POA: Diagnosis not present

## 2017-03-16 DIAGNOSIS — M9901 Segmental and somatic dysfunction of cervical region: Secondary | ICD-10-CM | POA: Diagnosis not present

## 2017-03-16 DIAGNOSIS — M5030 Other cervical disc degeneration, unspecified cervical region: Secondary | ICD-10-CM | POA: Diagnosis not present

## 2017-03-16 DIAGNOSIS — M545 Low back pain: Secondary | ICD-10-CM | POA: Diagnosis not present

## 2017-03-16 DIAGNOSIS — M9903 Segmental and somatic dysfunction of lumbar region: Secondary | ICD-10-CM | POA: Diagnosis not present

## 2017-03-17 DIAGNOSIS — M545 Low back pain: Secondary | ICD-10-CM | POA: Diagnosis not present

## 2017-03-17 DIAGNOSIS — M9903 Segmental and somatic dysfunction of lumbar region: Secondary | ICD-10-CM | POA: Diagnosis not present

## 2017-03-17 DIAGNOSIS — M9901 Segmental and somatic dysfunction of cervical region: Secondary | ICD-10-CM | POA: Diagnosis not present

## 2017-03-17 DIAGNOSIS — M5030 Other cervical disc degeneration, unspecified cervical region: Secondary | ICD-10-CM | POA: Diagnosis not present

## 2017-03-21 ENCOUNTER — Other Ambulatory Visit: Payer: Self-pay | Admitting: Internal Medicine

## 2017-03-23 ENCOUNTER — Other Ambulatory Visit: Payer: Self-pay | Admitting: Internal Medicine

## 2017-05-28 DIAGNOSIS — N183 Chronic kidney disease, stage 3 (moderate): Secondary | ICD-10-CM | POA: Diagnosis not present

## 2017-05-28 DIAGNOSIS — E785 Hyperlipidemia, unspecified: Secondary | ICD-10-CM | POA: Diagnosis not present

## 2017-05-28 LAB — LIPID PANEL
Cholesterol: 160 (ref 0–200)
HDL: 54 (ref 35–70)
LDL CALC: 88
Triglycerides: 91 (ref 40–160)

## 2017-05-28 LAB — BASIC METABOLIC PANEL
BUN: 26 — AB (ref 4–21)
Creatinine: 1.7 — AB (ref 0.6–1.3)
Glucose: 97
POTASSIUM: 4.5 (ref 3.4–5.3)
Sodium: 144 (ref 137–147)

## 2017-05-28 LAB — CBC AND DIFFERENTIAL
HEMATOCRIT: 38 — AB (ref 41–53)
Hemoglobin: 13.3 — AB (ref 13.5–17.5)
NEUTROS ABS: 4
PLATELETS: 239 (ref 150–399)
WBC: 7.2

## 2017-05-28 LAB — HEPATIC FUNCTION PANEL
ALK PHOS: 56 (ref 25–125)
ALT: 20 (ref 10–40)
AST: 17 (ref 14–40)
Bilirubin, Total: 0.4

## 2017-05-28 LAB — VITAMIN D 25 HYDROXY (VIT D DEFICIENCY, FRACTURES): VIT D 25 HYDROXY: 29.3

## 2017-06-03 DIAGNOSIS — I129 Hypertensive chronic kidney disease with stage 1 through stage 4 chronic kidney disease, or unspecified chronic kidney disease: Secondary | ICD-10-CM | POA: Diagnosis not present

## 2017-06-03 DIAGNOSIS — N183 Chronic kidney disease, stage 3 (moderate): Secondary | ICD-10-CM | POA: Diagnosis not present

## 2017-06-03 DIAGNOSIS — N028 Recurrent and persistent hematuria with other morphologic changes: Secondary | ICD-10-CM | POA: Diagnosis not present

## 2017-06-03 DIAGNOSIS — R809 Proteinuria, unspecified: Secondary | ICD-10-CM | POA: Diagnosis not present

## 2017-06-03 DIAGNOSIS — Z23 Encounter for immunization: Secondary | ICD-10-CM | POA: Diagnosis not present

## 2017-06-14 ENCOUNTER — Encounter: Payer: Self-pay | Admitting: Internal Medicine

## 2017-11-03 DIAGNOSIS — M545 Low back pain: Secondary | ICD-10-CM | POA: Diagnosis not present

## 2017-11-03 DIAGNOSIS — M9903 Segmental and somatic dysfunction of lumbar region: Secondary | ICD-10-CM | POA: Diagnosis not present

## 2017-11-03 DIAGNOSIS — M9901 Segmental and somatic dysfunction of cervical region: Secondary | ICD-10-CM | POA: Diagnosis not present

## 2017-11-03 DIAGNOSIS — M5030 Other cervical disc degeneration, unspecified cervical region: Secondary | ICD-10-CM | POA: Diagnosis not present

## 2017-11-04 DIAGNOSIS — M5030 Other cervical disc degeneration, unspecified cervical region: Secondary | ICD-10-CM | POA: Diagnosis not present

## 2017-11-04 DIAGNOSIS — M545 Low back pain: Secondary | ICD-10-CM | POA: Diagnosis not present

## 2017-11-04 DIAGNOSIS — M9903 Segmental and somatic dysfunction of lumbar region: Secondary | ICD-10-CM | POA: Diagnosis not present

## 2017-11-04 DIAGNOSIS — M9901 Segmental and somatic dysfunction of cervical region: Secondary | ICD-10-CM | POA: Diagnosis not present

## 2017-11-19 DIAGNOSIS — N183 Chronic kidney disease, stage 3 (moderate): Secondary | ICD-10-CM | POA: Diagnosis not present

## 2017-11-19 DIAGNOSIS — E785 Hyperlipidemia, unspecified: Secondary | ICD-10-CM | POA: Diagnosis not present

## 2018-01-27 DIAGNOSIS — I129 Hypertensive chronic kidney disease with stage 1 through stage 4 chronic kidney disease, or unspecified chronic kidney disease: Secondary | ICD-10-CM | POA: Diagnosis not present

## 2018-01-27 DIAGNOSIS — N028 Recurrent and persistent hematuria with other morphologic changes: Secondary | ICD-10-CM | POA: Diagnosis not present

## 2018-01-27 DIAGNOSIS — R809 Proteinuria, unspecified: Secondary | ICD-10-CM | POA: Diagnosis not present

## 2018-01-27 DIAGNOSIS — E785 Hyperlipidemia, unspecified: Secondary | ICD-10-CM | POA: Diagnosis not present

## 2018-01-27 DIAGNOSIS — N183 Chronic kidney disease, stage 3 (moderate): Secondary | ICD-10-CM | POA: Diagnosis not present

## 2018-01-27 LAB — CBC AND DIFFERENTIAL
HCT: 41 (ref 41–53)
HEMOGLOBIN: 13.8 (ref 13.5–17.5)
Neutrophils Absolute: 4
Platelets: 221 (ref 150–399)
WBC: 6.2

## 2018-01-27 LAB — HEPATIC FUNCTION PANEL
ALT: 29 (ref 10–40)
AST: 21 (ref 14–40)
Alkaline Phosphatase: 57 (ref 25–125)
Bilirubin, Total: 0.3

## 2018-01-27 LAB — BASIC METABOLIC PANEL
BUN: 29 — AB (ref 4–21)
CREATININE: 1.8 — AB (ref 0.6–1.3)
GLUCOSE: 106
Potassium: 4.5 (ref 3.4–5.3)
Sodium: 149 — AB (ref 137–147)

## 2018-01-27 LAB — LIPID PANEL
CHOLESTEROL: 180 (ref 0–200)
HDL: 70 (ref 35–70)
LDL Cholesterol: 98
Triglycerides: 60 (ref 40–160)

## 2018-02-02 ENCOUNTER — Encounter: Payer: Self-pay | Admitting: Internal Medicine

## 2018-04-20 DIAGNOSIS — M9901 Segmental and somatic dysfunction of cervical region: Secondary | ICD-10-CM | POA: Diagnosis not present

## 2018-04-20 DIAGNOSIS — M545 Low back pain: Secondary | ICD-10-CM | POA: Diagnosis not present

## 2018-04-20 DIAGNOSIS — M5030 Other cervical disc degeneration, unspecified cervical region: Secondary | ICD-10-CM | POA: Diagnosis not present

## 2018-04-20 DIAGNOSIS — M9903 Segmental and somatic dysfunction of lumbar region: Secondary | ICD-10-CM | POA: Diagnosis not present

## 2018-04-21 DIAGNOSIS — M9903 Segmental and somatic dysfunction of lumbar region: Secondary | ICD-10-CM | POA: Diagnosis not present

## 2018-04-21 DIAGNOSIS — M9901 Segmental and somatic dysfunction of cervical region: Secondary | ICD-10-CM | POA: Diagnosis not present

## 2018-04-21 DIAGNOSIS — M545 Low back pain: Secondary | ICD-10-CM | POA: Diagnosis not present

## 2018-04-21 DIAGNOSIS — M5030 Other cervical disc degeneration, unspecified cervical region: Secondary | ICD-10-CM | POA: Diagnosis not present

## 2018-04-27 DIAGNOSIS — M9901 Segmental and somatic dysfunction of cervical region: Secondary | ICD-10-CM | POA: Diagnosis not present

## 2018-04-27 DIAGNOSIS — M545 Low back pain: Secondary | ICD-10-CM | POA: Diagnosis not present

## 2018-04-27 DIAGNOSIS — M5030 Other cervical disc degeneration, unspecified cervical region: Secondary | ICD-10-CM | POA: Diagnosis not present

## 2018-04-27 DIAGNOSIS — M9903 Segmental and somatic dysfunction of lumbar region: Secondary | ICD-10-CM | POA: Diagnosis not present

## 2018-04-28 DIAGNOSIS — M9903 Segmental and somatic dysfunction of lumbar region: Secondary | ICD-10-CM | POA: Diagnosis not present

## 2018-04-28 DIAGNOSIS — M545 Low back pain: Secondary | ICD-10-CM | POA: Diagnosis not present

## 2018-04-28 DIAGNOSIS — M5030 Other cervical disc degeneration, unspecified cervical region: Secondary | ICD-10-CM | POA: Diagnosis not present

## 2018-04-28 DIAGNOSIS — M9901 Segmental and somatic dysfunction of cervical region: Secondary | ICD-10-CM | POA: Diagnosis not present

## 2018-05-04 DIAGNOSIS — M545 Low back pain: Secondary | ICD-10-CM | POA: Diagnosis not present

## 2018-05-04 DIAGNOSIS — M9901 Segmental and somatic dysfunction of cervical region: Secondary | ICD-10-CM | POA: Diagnosis not present

## 2018-05-04 DIAGNOSIS — M5030 Other cervical disc degeneration, unspecified cervical region: Secondary | ICD-10-CM | POA: Diagnosis not present

## 2018-05-04 DIAGNOSIS — M9903 Segmental and somatic dysfunction of lumbar region: Secondary | ICD-10-CM | POA: Diagnosis not present

## 2018-05-10 DIAGNOSIS — M5030 Other cervical disc degeneration, unspecified cervical region: Secondary | ICD-10-CM | POA: Diagnosis not present

## 2018-05-10 DIAGNOSIS — M9901 Segmental and somatic dysfunction of cervical region: Secondary | ICD-10-CM | POA: Diagnosis not present

## 2018-05-10 DIAGNOSIS — M545 Low back pain: Secondary | ICD-10-CM | POA: Diagnosis not present

## 2018-05-10 DIAGNOSIS — M9903 Segmental and somatic dysfunction of lumbar region: Secondary | ICD-10-CM | POA: Diagnosis not present

## 2018-05-12 DIAGNOSIS — M9903 Segmental and somatic dysfunction of lumbar region: Secondary | ICD-10-CM | POA: Diagnosis not present

## 2018-05-12 DIAGNOSIS — M545 Low back pain: Secondary | ICD-10-CM | POA: Diagnosis not present

## 2018-05-12 DIAGNOSIS — M5030 Other cervical disc degeneration, unspecified cervical region: Secondary | ICD-10-CM | POA: Diagnosis not present

## 2018-05-12 DIAGNOSIS — M9901 Segmental and somatic dysfunction of cervical region: Secondary | ICD-10-CM | POA: Diagnosis not present

## 2018-05-16 DIAGNOSIS — M545 Low back pain: Secondary | ICD-10-CM | POA: Diagnosis not present

## 2018-05-16 DIAGNOSIS — M9901 Segmental and somatic dysfunction of cervical region: Secondary | ICD-10-CM | POA: Diagnosis not present

## 2018-05-16 DIAGNOSIS — M5030 Other cervical disc degeneration, unspecified cervical region: Secondary | ICD-10-CM | POA: Diagnosis not present

## 2018-05-16 DIAGNOSIS — M9903 Segmental and somatic dysfunction of lumbar region: Secondary | ICD-10-CM | POA: Diagnosis not present

## 2018-05-19 DIAGNOSIS — M5030 Other cervical disc degeneration, unspecified cervical region: Secondary | ICD-10-CM | POA: Diagnosis not present

## 2018-05-19 DIAGNOSIS — M9901 Segmental and somatic dysfunction of cervical region: Secondary | ICD-10-CM | POA: Diagnosis not present

## 2018-05-19 DIAGNOSIS — M545 Low back pain: Secondary | ICD-10-CM | POA: Diagnosis not present

## 2018-05-19 DIAGNOSIS — M9903 Segmental and somatic dysfunction of lumbar region: Secondary | ICD-10-CM | POA: Diagnosis not present

## 2018-05-26 DIAGNOSIS — M9901 Segmental and somatic dysfunction of cervical region: Secondary | ICD-10-CM | POA: Diagnosis not present

## 2018-05-26 DIAGNOSIS — M545 Low back pain: Secondary | ICD-10-CM | POA: Diagnosis not present

## 2018-05-26 DIAGNOSIS — M5030 Other cervical disc degeneration, unspecified cervical region: Secondary | ICD-10-CM | POA: Diagnosis not present

## 2018-05-26 DIAGNOSIS — M9903 Segmental and somatic dysfunction of lumbar region: Secondary | ICD-10-CM | POA: Diagnosis not present

## 2018-05-30 DIAGNOSIS — M9903 Segmental and somatic dysfunction of lumbar region: Secondary | ICD-10-CM | POA: Diagnosis not present

## 2018-05-30 DIAGNOSIS — M9901 Segmental and somatic dysfunction of cervical region: Secondary | ICD-10-CM | POA: Diagnosis not present

## 2018-05-30 DIAGNOSIS — M5030 Other cervical disc degeneration, unspecified cervical region: Secondary | ICD-10-CM | POA: Diagnosis not present

## 2018-05-30 DIAGNOSIS — M545 Low back pain: Secondary | ICD-10-CM | POA: Diagnosis not present

## 2018-06-15 DIAGNOSIS — M5030 Other cervical disc degeneration, unspecified cervical region: Secondary | ICD-10-CM | POA: Diagnosis not present

## 2018-06-15 DIAGNOSIS — M545 Low back pain: Secondary | ICD-10-CM | POA: Diagnosis not present

## 2018-06-15 DIAGNOSIS — M9903 Segmental and somatic dysfunction of lumbar region: Secondary | ICD-10-CM | POA: Diagnosis not present

## 2018-06-15 DIAGNOSIS — M9901 Segmental and somatic dysfunction of cervical region: Secondary | ICD-10-CM | POA: Diagnosis not present

## 2018-06-21 DIAGNOSIS — M5030 Other cervical disc degeneration, unspecified cervical region: Secondary | ICD-10-CM | POA: Diagnosis not present

## 2018-06-21 DIAGNOSIS — M9901 Segmental and somatic dysfunction of cervical region: Secondary | ICD-10-CM | POA: Diagnosis not present

## 2018-06-21 DIAGNOSIS — M9903 Segmental and somatic dysfunction of lumbar region: Secondary | ICD-10-CM | POA: Diagnosis not present

## 2018-06-21 DIAGNOSIS — M545 Low back pain: Secondary | ICD-10-CM | POA: Diagnosis not present

## 2018-06-23 DIAGNOSIS — M5030 Other cervical disc degeneration, unspecified cervical region: Secondary | ICD-10-CM | POA: Diagnosis not present

## 2018-06-23 DIAGNOSIS — M9903 Segmental and somatic dysfunction of lumbar region: Secondary | ICD-10-CM | POA: Diagnosis not present

## 2018-06-23 DIAGNOSIS — M9901 Segmental and somatic dysfunction of cervical region: Secondary | ICD-10-CM | POA: Diagnosis not present

## 2018-06-23 DIAGNOSIS — M545 Low back pain: Secondary | ICD-10-CM | POA: Diagnosis not present

## 2018-08-23 DIAGNOSIS — D225 Melanocytic nevi of trunk: Secondary | ICD-10-CM | POA: Diagnosis not present

## 2018-08-23 DIAGNOSIS — L578 Other skin changes due to chronic exposure to nonionizing radiation: Secondary | ICD-10-CM | POA: Diagnosis not present

## 2018-08-23 DIAGNOSIS — L821 Other seborrheic keratosis: Secondary | ICD-10-CM | POA: Diagnosis not present

## 2018-08-23 DIAGNOSIS — L814 Other melanin hyperpigmentation: Secondary | ICD-10-CM | POA: Diagnosis not present

## 2018-08-23 DIAGNOSIS — L570 Actinic keratosis: Secondary | ICD-10-CM | POA: Diagnosis not present

## 2018-08-30 DIAGNOSIS — N183 Chronic kidney disease, stage 3 (moderate): Secondary | ICD-10-CM | POA: Diagnosis not present

## 2018-08-30 DIAGNOSIS — I129 Hypertensive chronic kidney disease with stage 1 through stage 4 chronic kidney disease, or unspecified chronic kidney disease: Secondary | ICD-10-CM | POA: Diagnosis not present

## 2018-08-30 DIAGNOSIS — R809 Proteinuria, unspecified: Secondary | ICD-10-CM | POA: Diagnosis not present

## 2018-08-30 DIAGNOSIS — N028 Recurrent and persistent hematuria with other morphologic changes: Secondary | ICD-10-CM | POA: Diagnosis not present

## 2018-08-30 DIAGNOSIS — E785 Hyperlipidemia, unspecified: Secondary | ICD-10-CM | POA: Diagnosis not present

## 2018-12-01 ENCOUNTER — Telehealth: Payer: Self-pay

## 2018-12-01 NOTE — Telephone Encounter (Signed)
Spoke w/ Pt- last ov 2017- made him aware of virtual visits. Declines for now.

## 2018-12-28 DIAGNOSIS — M9903 Segmental and somatic dysfunction of lumbar region: Secondary | ICD-10-CM | POA: Diagnosis not present

## 2018-12-28 DIAGNOSIS — M5030 Other cervical disc degeneration, unspecified cervical region: Secondary | ICD-10-CM | POA: Diagnosis not present

## 2018-12-28 DIAGNOSIS — M545 Low back pain: Secondary | ICD-10-CM | POA: Diagnosis not present

## 2018-12-28 DIAGNOSIS — M9901 Segmental and somatic dysfunction of cervical region: Secondary | ICD-10-CM | POA: Diagnosis not present

## 2018-12-29 DIAGNOSIS — M5030 Other cervical disc degeneration, unspecified cervical region: Secondary | ICD-10-CM | POA: Diagnosis not present

## 2018-12-29 DIAGNOSIS — M545 Low back pain: Secondary | ICD-10-CM | POA: Diagnosis not present

## 2018-12-29 DIAGNOSIS — M9903 Segmental and somatic dysfunction of lumbar region: Secondary | ICD-10-CM | POA: Diagnosis not present

## 2018-12-29 DIAGNOSIS — M9901 Segmental and somatic dysfunction of cervical region: Secondary | ICD-10-CM | POA: Diagnosis not present

## 2019-01-04 DIAGNOSIS — M545 Low back pain: Secondary | ICD-10-CM | POA: Diagnosis not present

## 2019-01-04 DIAGNOSIS — M9901 Segmental and somatic dysfunction of cervical region: Secondary | ICD-10-CM | POA: Diagnosis not present

## 2019-01-04 DIAGNOSIS — M9903 Segmental and somatic dysfunction of lumbar region: Secondary | ICD-10-CM | POA: Diagnosis not present

## 2019-01-04 DIAGNOSIS — M5030 Other cervical disc degeneration, unspecified cervical region: Secondary | ICD-10-CM | POA: Diagnosis not present

## 2019-01-11 DIAGNOSIS — M5030 Other cervical disc degeneration, unspecified cervical region: Secondary | ICD-10-CM | POA: Diagnosis not present

## 2019-01-11 DIAGNOSIS — M9901 Segmental and somatic dysfunction of cervical region: Secondary | ICD-10-CM | POA: Diagnosis not present

## 2019-01-11 DIAGNOSIS — M545 Low back pain: Secondary | ICD-10-CM | POA: Diagnosis not present

## 2019-01-11 DIAGNOSIS — M9903 Segmental and somatic dysfunction of lumbar region: Secondary | ICD-10-CM | POA: Diagnosis not present

## 2019-01-18 DIAGNOSIS — M5030 Other cervical disc degeneration, unspecified cervical region: Secondary | ICD-10-CM | POA: Diagnosis not present

## 2019-01-18 DIAGNOSIS — M9901 Segmental and somatic dysfunction of cervical region: Secondary | ICD-10-CM | POA: Diagnosis not present

## 2019-01-18 DIAGNOSIS — M9903 Segmental and somatic dysfunction of lumbar region: Secondary | ICD-10-CM | POA: Diagnosis not present

## 2019-01-18 DIAGNOSIS — M545 Low back pain: Secondary | ICD-10-CM | POA: Diagnosis not present

## 2019-01-25 DIAGNOSIS — M545 Low back pain: Secondary | ICD-10-CM | POA: Diagnosis not present

## 2019-01-25 DIAGNOSIS — M9903 Segmental and somatic dysfunction of lumbar region: Secondary | ICD-10-CM | POA: Diagnosis not present

## 2019-01-25 DIAGNOSIS — M5030 Other cervical disc degeneration, unspecified cervical region: Secondary | ICD-10-CM | POA: Diagnosis not present

## 2019-01-25 DIAGNOSIS — M9901 Segmental and somatic dysfunction of cervical region: Secondary | ICD-10-CM | POA: Diagnosis not present

## 2019-03-01 DIAGNOSIS — M9901 Segmental and somatic dysfunction of cervical region: Secondary | ICD-10-CM | POA: Diagnosis not present

## 2019-03-01 DIAGNOSIS — M545 Low back pain: Secondary | ICD-10-CM | POA: Diagnosis not present

## 2019-03-01 DIAGNOSIS — M9903 Segmental and somatic dysfunction of lumbar region: Secondary | ICD-10-CM | POA: Diagnosis not present

## 2019-03-01 DIAGNOSIS — M5030 Other cervical disc degeneration, unspecified cervical region: Secondary | ICD-10-CM | POA: Diagnosis not present

## 2019-03-27 DIAGNOSIS — M545 Low back pain: Secondary | ICD-10-CM | POA: Diagnosis not present

## 2019-03-27 DIAGNOSIS — M9903 Segmental and somatic dysfunction of lumbar region: Secondary | ICD-10-CM | POA: Diagnosis not present

## 2019-03-27 DIAGNOSIS — M5030 Other cervical disc degeneration, unspecified cervical region: Secondary | ICD-10-CM | POA: Diagnosis not present

## 2019-03-27 DIAGNOSIS — M9901 Segmental and somatic dysfunction of cervical region: Secondary | ICD-10-CM | POA: Diagnosis not present

## 2019-03-29 ENCOUNTER — Encounter: Payer: Self-pay | Admitting: Internal Medicine

## 2019-04-05 DIAGNOSIS — M9901 Segmental and somatic dysfunction of cervical region: Secondary | ICD-10-CM | POA: Diagnosis not present

## 2019-04-05 DIAGNOSIS — M9903 Segmental and somatic dysfunction of lumbar region: Secondary | ICD-10-CM | POA: Diagnosis not present

## 2019-04-05 DIAGNOSIS — M5030 Other cervical disc degeneration, unspecified cervical region: Secondary | ICD-10-CM | POA: Diagnosis not present

## 2019-04-05 DIAGNOSIS — M545 Low back pain: Secondary | ICD-10-CM | POA: Diagnosis not present

## 2019-05-08 DIAGNOSIS — M5030 Other cervical disc degeneration, unspecified cervical region: Secondary | ICD-10-CM | POA: Diagnosis not present

## 2019-05-08 DIAGNOSIS — M9901 Segmental and somatic dysfunction of cervical region: Secondary | ICD-10-CM | POA: Diagnosis not present

## 2019-05-08 DIAGNOSIS — M9903 Segmental and somatic dysfunction of lumbar region: Secondary | ICD-10-CM | POA: Diagnosis not present

## 2019-05-08 DIAGNOSIS — M545 Low back pain: Secondary | ICD-10-CM | POA: Diagnosis not present

## 2019-05-10 DIAGNOSIS — M545 Low back pain: Secondary | ICD-10-CM | POA: Diagnosis not present

## 2019-05-10 DIAGNOSIS — N183 Chronic kidney disease, stage 3 (moderate): Secondary | ICD-10-CM | POA: Diagnosis not present

## 2019-05-10 DIAGNOSIS — E785 Hyperlipidemia, unspecified: Secondary | ICD-10-CM | POA: Diagnosis not present

## 2019-05-10 DIAGNOSIS — M5030 Other cervical disc degeneration, unspecified cervical region: Secondary | ICD-10-CM | POA: Diagnosis not present

## 2019-05-10 DIAGNOSIS — M9903 Segmental and somatic dysfunction of lumbar region: Secondary | ICD-10-CM | POA: Diagnosis not present

## 2019-05-10 DIAGNOSIS — M9901 Segmental and somatic dysfunction of cervical region: Secondary | ICD-10-CM | POA: Diagnosis not present

## 2019-05-16 DIAGNOSIS — I129 Hypertensive chronic kidney disease with stage 1 through stage 4 chronic kidney disease, or unspecified chronic kidney disease: Secondary | ICD-10-CM | POA: Diagnosis not present

## 2019-05-16 DIAGNOSIS — R809 Proteinuria, unspecified: Secondary | ICD-10-CM | POA: Diagnosis not present

## 2019-05-16 DIAGNOSIS — N028 Recurrent and persistent hematuria with other morphologic changes: Secondary | ICD-10-CM | POA: Diagnosis not present

## 2019-05-16 DIAGNOSIS — N183 Chronic kidney disease, stage 3 (moderate): Secondary | ICD-10-CM | POA: Diagnosis not present

## 2019-07-24 DIAGNOSIS — Z20828 Contact with and (suspected) exposure to other viral communicable diseases: Secondary | ICD-10-CM | POA: Diagnosis not present

## 2020-05-02 LAB — HEPATIC FUNCTION PANEL
ALT: 16 (ref 10–40)
AST: 16 (ref 14–40)
Alkaline Phosphatase: 56 (ref 25–125)
Bilirubin, Total: 0.2

## 2020-05-02 LAB — BASIC METABOLIC PANEL
BUN: 35 — AB (ref 4–21)
CO2: 26 — AB (ref 13–22)
Chloride: 110 — AB (ref 99–108)
Creatinine: 2.2 — AB (ref 0.6–1.3)
Glucose: 101
Potassium: 4.4 (ref 3.4–5.3)
Sodium: 144 (ref 137–147)

## 2020-05-02 LAB — COMPREHENSIVE METABOLIC PANEL
Albumin: 3.7 (ref 3.5–5.0)
Calcium: 9.8 (ref 8.7–10.7)
GFR calc Af Amer: 34
GFR calc non Af Amer: 30
Globulin: 1.8

## 2020-05-02 LAB — CBC: RBC: 4.03 (ref 3.87–5.11)

## 2020-05-02 LAB — CBC AND DIFFERENTIAL
HCT: 39 — AB (ref 41–53)
Hemoglobin: 12.2 — AB (ref 13.5–17.5)
Neutrophils Absolute: 4
Platelets: 318 (ref 150–399)
WBC: 6.3

## 2020-05-02 LAB — LIPID PANEL
Cholesterol: 176 (ref 0–200)
HDL: 54 (ref 35–70)
LDL Cholesterol: 107
Triglycerides: 78 (ref 40–160)

## 2020-05-28 ENCOUNTER — Encounter: Payer: Self-pay | Admitting: Internal Medicine

## 2020-11-14 ENCOUNTER — Other Ambulatory Visit: Payer: Self-pay | Admitting: Urology

## 2020-11-14 ENCOUNTER — Other Ambulatory Visit (HOSPITAL_COMMUNITY): Payer: Self-pay | Admitting: Urology

## 2020-11-14 DIAGNOSIS — R3121 Asymptomatic microscopic hematuria: Secondary | ICD-10-CM

## 2020-11-20 ENCOUNTER — Ambulatory Visit (HOSPITAL_COMMUNITY): Payer: BLUE CROSS/BLUE SHIELD

## 2020-12-02 ENCOUNTER — Other Ambulatory Visit: Payer: Self-pay

## 2020-12-02 ENCOUNTER — Ambulatory Visit (HOSPITAL_COMMUNITY)
Admission: RE | Admit: 2020-12-02 | Discharge: 2020-12-02 | Disposition: A | Discharge status: Home / Self Care | Payer: Federal, State, Local not specified - PPO | Source: Ambulatory Visit | Attending: Urology | Admitting: Urology

## 2020-12-02 DIAGNOSIS — R3121 Asymptomatic microscopic hematuria: Secondary | ICD-10-CM | POA: Diagnosis not present

## 2020-12-02 MED ORDER — GADOBUTROL 1 MMOL/ML IV SOLN
8.0000 mL | Freq: Once | INTRAVENOUS | Status: AC | PRN
  Administered 2020-12-02: 8 mL via INTRAVENOUS

## 2021-03-26 ENCOUNTER — Ambulatory Visit: Payer: Self-pay

## 2021-03-26 ENCOUNTER — Ambulatory Visit: Payer: Federal, State, Local not specified - PPO | Admitting: Family

## 2021-03-26 DIAGNOSIS — M7989 Other specified soft tissue disorders: Secondary | ICD-10-CM

## 2021-03-26 DIAGNOSIS — M25561 Pain in right knee: Secondary | ICD-10-CM | POA: Diagnosis not present

## 2021-03-26 NOTE — Progress Notes (Signed)
Office Visit Note   Patient: Shawn Cooley           Date of Birth: Dec 31, 1950           MRN: [id number] Visit Date: 03/26/2021              Requested by: Katherina Mires, MD [place],  [place] [address] PCP: Katherina Mires, MD  Chief Complaint  Patient presents with   Right Knee - Pain      HPI: The patient is a 70 year old gentleman who is seen today for initial evaluation of right knee pain.  He states that about 2 weeks ago he was lifting something heavy and had a pivoting injury to his right knee.  He initially had significant pain and fullness in the popliteal fossa.  He massaged this for about a week and then during massage she states that he felt it burst and then felt the fluid go down inside his calf.  He has since had swelling and tightness in his calf down to his ankle states it the calf is "hard as a rock".  Tenderness throughout the lower extremity.  He is unable to take anti-inflammatories due to his kidney function.  Assessment & Plan: Visit Diagnoses:  1. Acute pain of right knee     Plan: At this point he is not having symptoms of his knee.  Likely had a Baker's cyst rupture . discussed conservative management of the edema recommended compression garments elevation he would like to continue using warmth as this has been helpful.  We will follow-up in the office as needed.  Follow-Up Instructions: No follow-ups on file.   Right Knee Exam   Tenderness  The patient is experiencing no tenderness.   Range of Motion  The patient has normal right knee ROM.  Tests  Varus: negative Valgus: negative  Other  Swelling: mild Effusion: no effusion present     Patient is alert, oriented, no adenopathy, well-dressed, normal affect, normal respiratory effort. On examination of the right lower extremity he does have 2+ pitting edema from the ankle up to the tibial tubercle there is no erythema no warmth no palpable cords negative  [place]  Imaging: No results found. No images are attached to the encounter.  Labs: No results found for: HGBA1C, ESRSEDRATE, CRP, LABURIC, REPTSTATUS, GRAMSTAIN, CULT, LABORGA   Lab Results  Component Value Date   ALBUMIN 3.7 05/02/2020   ALBUMIN 3.8 01/29/2016    No results found for: MG Lab Results  Component Value Date   VD25OH 29.3 05/28/2017    No results found for: PREALBUMIN CBC EXTENDED Latest Ref Rng & Units 05/02/2020 01/27/2018 05/28/2017  WBC - 6.3 6.2 7.2  RBC 3.87 - 5.11 4.03 - -  HGB 13.5 - 17.5 12.2(A) 13.8 13.3(A)  HCT 41 - 53 39(A) 41 38(A)  PLT 150 - 399 318 221 239  NEUTROABS - '4 4 4     '$ There is no height or weight on file to calculate BMI.  Orders:  Orders Placed This Encounter  Procedures   XR Knee 1-2 Views Right   No orders of the defined types were placed in this encounter.    Procedures: No procedures performed  Clinical Data: No additional findings.  ROS:  All other systems negative, except as noted in the HPI. Review of Systems  Constitutional:  Negative for chills and fever.  Cardiovascular:  Positive for leg swelling.  Musculoskeletal:  Positive for  joint swelling and myalgias. Negative for arthralgias.  Skin:  Negative for color change.   Objective: Vital Signs: There were no vitals taken for this visit.  Specialty Comments:  No specialty comments available.  PMFS History: Patient Active Problem List   Diagnosis Date Noted   Annual physical exam 01/29/2016   PCP NOTES >>>>>>>>>>>>>>>>>>>> 01/29/2016   CKD (chronic kidney disease), stage III ([place]) 08/29/2015   Dyslipidemia 01/25/2007   Essential hypertension 01/25/2007   DIS, HTN CKD, MLIG, W/CKD, STG I-IV/UNSPC 01/25/2007   Asthma 01/25/2007   Past Medical History:  Diagnosis Date   Asthma    Hyperlipidemia    Hypertension    Kidney disease    Stage 3   Spinal stenosis    C5-6 HNP    Family History  Problem Relation Age of Onset   Stroke Paternal Aunt     Stroke Paternal Uncle    Cancer Paternal Aunt        ?   Hypertension Mother    Colon cancer Neg Hx    Prostate cancer Neg Hx    Diabetes Neg Hx     Past Surgical History:  Procedure Laterality Date   CYST REMOVAL TRUNK  1970   pilonidal   RENAL BIOPSY     TONSILLECTOMY     Social History   Occupational History   Occupation: Sales  Tobacco Use   Smoking status: Some Days    Types: Cigarettes    Last attempt to quit: 08/24/2006    Years since quitting: 14.5   Smokeless tobacco: Never   Tobacco comments:    still smokes , rarely, used to smoke more   Substance and Sexual Activity   Alcohol use: Yes    Alcohol/week: 0.0 standard drinks    Comment: not daily, beer , occ 3-4 /day   Drug use: No   Sexual activity: Not on file

## 2021-04-03 ENCOUNTER — Ambulatory Visit: Payer: Federal, State, Local not specified - PPO | Admitting: Orthopedic Surgery

## 2021-04-17 ENCOUNTER — Encounter (HOSPITAL_BASED_OUTPATIENT_CLINIC_OR_DEPARTMENT_OTHER): Payer: Self-pay

## 2021-04-17 ENCOUNTER — Emergency Department (HOSPITAL_BASED_OUTPATIENT_CLINIC_OR_DEPARTMENT_OTHER): Payer: Medicare Other

## 2021-04-17 ENCOUNTER — Inpatient Hospital Stay (HOSPITAL_BASED_OUTPATIENT_CLINIC_OR_DEPARTMENT_OTHER)
Admission: EM | Admit: 2021-04-17 | Discharge: 2021-04-20 | DRG: 300 | Disposition: A | Discharge status: Home / Self Care | Payer: Medicare Other | Attending: Internal Medicine | Admitting: Internal Medicine

## 2021-04-17 ENCOUNTER — Other Ambulatory Visit: Payer: Self-pay

## 2021-04-17 DIAGNOSIS — Z20822 Contact with and (suspected) exposure to covid-19: Secondary | ICD-10-CM | POA: Diagnosis present

## 2021-04-17 DIAGNOSIS — E785 Hyperlipidemia, unspecified: Secondary | ICD-10-CM | POA: Diagnosis present

## 2021-04-17 DIAGNOSIS — N028 Recurrent and persistent hematuria with other morphologic changes: Secondary | ICD-10-CM | POA: Diagnosis present

## 2021-04-17 DIAGNOSIS — M79604 Pain in right leg: Secondary | ICD-10-CM

## 2021-04-17 DIAGNOSIS — N1832 Chronic kidney disease, stage 3b: Secondary | ICD-10-CM | POA: Diagnosis present

## 2021-04-17 DIAGNOSIS — N02B9 Other recurrent and persistent immunoglobulin A nephropathy: Secondary | ICD-10-CM | POA: Diagnosis present

## 2021-04-17 DIAGNOSIS — Z880 Allergy status to penicillin: Secondary | ICD-10-CM

## 2021-04-17 DIAGNOSIS — I82401 Acute embolism and thrombosis of unspecified deep veins of right lower extremity: Secondary | ICD-10-CM | POA: Diagnosis present

## 2021-04-17 DIAGNOSIS — Z79899 Other long term (current) drug therapy: Secondary | ICD-10-CM

## 2021-04-17 DIAGNOSIS — Z87891 Personal history of nicotine dependence: Secondary | ICD-10-CM

## 2021-04-17 DIAGNOSIS — Z8249 Family history of ischemic heart disease and other diseases of the circulatory system: Secondary | ICD-10-CM

## 2021-04-17 DIAGNOSIS — I82411 Acute embolism and thrombosis of right femoral vein: Secondary | ICD-10-CM | POA: Diagnosis not present

## 2021-04-17 DIAGNOSIS — M7989 Other specified soft tissue disorders: Secondary | ICD-10-CM

## 2021-04-17 DIAGNOSIS — I824Y1 Acute embolism and thrombosis of unspecified deep veins of right proximal lower extremity: Secondary | ICD-10-CM

## 2021-04-17 DIAGNOSIS — I10 Essential (primary) hypertension: Secondary | ICD-10-CM | POA: Diagnosis present

## 2021-04-17 DIAGNOSIS — I129 Hypertensive chronic kidney disease with stage 1 through stage 4 chronic kidney disease, or unspecified chronic kidney disease: Secondary | ICD-10-CM | POA: Diagnosis present

## 2021-04-17 DIAGNOSIS — I82431 Acute embolism and thrombosis of right popliteal vein: Secondary | ICD-10-CM | POA: Diagnosis present

## 2021-04-17 DIAGNOSIS — J45909 Unspecified asthma, uncomplicated: Secondary | ICD-10-CM | POA: Diagnosis present

## 2021-04-17 DIAGNOSIS — C61 Malignant neoplasm of prostate: Secondary | ICD-10-CM | POA: Diagnosis present

## 2021-04-17 HISTORY — DX: Malignant neoplasm of prostate: C61

## 2021-04-17 LAB — SEDIMENTATION RATE: Sed Rate: 19 mm/hr — ABNORMAL HIGH (ref 0–16)

## 2021-04-17 LAB — BASIC METABOLIC PANEL
Anion gap: 8 (ref 5–15)
BUN: 34 mg/dL — ABNORMAL HIGH (ref 8–23)
CO2: 28 mmol/L (ref 22–32)
Calcium: 9.3 mg/dL (ref 8.9–10.3)
Chloride: 107 mmol/L (ref 98–111)
Creatinine, Ser: 2.5 mg/dL — ABNORMAL HIGH (ref 0.61–1.24)
GFR, Estimated: 27 mL/min — ABNORMAL LOW (ref >60)
Glucose, Bld: 105 mg/dL — ABNORMAL HIGH (ref 70–99)
Potassium: 4.1 mmol/L (ref 3.5–5.1)
Sodium: 143 mmol/L (ref 135–145)

## 2021-04-17 LAB — CBC WITH DIFFERENTIAL/PLATELET
Abs Immature Granulocytes: 0.03 10*3/uL (ref 0.00–0.07)
Basophils Absolute: 0.1 10*3/uL (ref 0.0–0.1)
Basophils Relative: 1 %
Eosinophils Absolute: 0.4 10*3/uL (ref 0.0–0.5)
Eosinophils Relative: 4 %
HCT: 34.8 % — ABNORMAL LOW (ref 39.0–52.0)
Hemoglobin: 11.1 g/dL — ABNORMAL LOW (ref 13.0–17.0)
Immature Granulocytes: 0 %
Lymphocytes Relative: 26 %
Lymphs Abs: 2.2 10*3/uL (ref 0.7–4.0)
MCH: 30.5 pg (ref 26.0–34.0)
MCHC: 31.9 g/dL (ref 30.0–36.0)
MCV: 95.6 fL (ref 80.0–100.0)
Monocytes Absolute: 1 10*3/uL (ref 0.1–1.0)
Monocytes Relative: 12 %
Neutro Abs: 4.8 10*3/uL (ref 1.7–7.7)
Neutrophils Relative %: 57 %
Platelets: 274 10*3/uL (ref 150–400)
RBC: 3.64 MIL/uL — ABNORMAL LOW (ref 4.22–5.81)
RDW: 13.3 % (ref 11.5–15.5)
WBC: 8.5 10*3/uL (ref 4.0–10.5)
nRBC: 0 % (ref 0.0–0.2)

## 2021-04-17 MED ORDER — CEPHALEXIN 250 MG PO CAPS
500.0000 mg | ORAL_CAPSULE | Freq: Once | ORAL | Status: AC
  Administered 2021-04-17: 500 mg via ORAL
  Filled 2021-04-17: qty 2

## 2021-04-17 MED ORDER — CEPHALEXIN 500 MG PO CAPS: 500.0000 mg | ORAL_CAPSULE | Freq: Four times a day (QID) | ORAL | 0 refills | Status: DC

## 2021-04-17 MED ORDER — IBUPROFEN 600 MG PO TABS
600.0000 mg | ORAL_TABLET | Freq: Once | ORAL | Status: DC
  Filled 2021-04-17: qty 1

## 2021-04-17 NOTE — ED Triage Notes (Signed)
Reports 3 weeks ago had bakers cyst rupture and now right leg is swollen all the way down to ankle.  Reports its the worst its been in 3 weeks.

## 2021-04-17 NOTE — ED Provider Notes (Addendum)
[facility] EMERGENCY DEPT Provider Note   CSN: [id number] Arrival date & time: 04/17/21  2053     History Chief Complaint  Patient presents with   Leg Swelling    Shawn Cooley is a 70 y.o. male.  Patient is a 70 yo male presenting for leg pain and swelling. Patient states he had a baker cyst behind left knee dx by orthpedic surgery 3 weeks ago that ruptured. States since then he has had progressive left leg swelling with new redness, warmth, and pain. Denies any recent falls/injuries. Denies open wounds or sores. Denies fevers, chills, nausea, or myalgias.   The history is provided by the patient. No language interpreter was used.  Leg Pain Location:  Leg Injury: no   Leg location:  R leg Pain details:    Quality:  Aching Associated symptoms: no fatigue and no fever       Past Medical History:  Diagnosis Date   Asthma    Hyperlipidemia    Hypertension    Kidney disease    Stage 3   Spinal stenosis    C5-6 HNP    Patient Active Problem List   Diagnosis Date Noted   Annual physical exam 01/29/2016   PCP NOTES >>>>>>>>>>>>>>>>>>>> 01/29/2016   CKD (chronic kidney disease), stage III ([place]) 08/29/2015   Dyslipidemia 01/25/2007   Essential hypertension 01/25/2007   DIS, HTN CKD, MLIG, W/CKD, STG I-IV/UNSPC 01/25/2007   Asthma 01/25/2007    Past Surgical History:  Procedure Laterality Date   CYST REMOVAL TRUNK  1970   pilonidal   RENAL BIOPSY     TONSILLECTOMY         Family History  Problem Relation Age of Onset   Stroke Paternal Aunt    Stroke Paternal Uncle    Cancer Paternal Aunt        ?   Hypertension Mother    Colon cancer Neg Hx    Prostate cancer Neg Hx    Diabetes Neg Hx     Social History   Tobacco Use   Smoking status: Some Days    Types: Cigarettes    Last attempt to quit: 08/24/2006    Years since quitting: 14.6   Smokeless tobacco: Never   Tobacco comments:    still smokes , rarely, used to smoke more    Substance Use Topics   Alcohol use: Yes    Alcohol/week: 0.0 standard drinks    Comment: not daily, beer , occ 3-4 /day   Drug use: No    Home Medications Prior to Admission medications   Medication Sig Start Date End Date Taking? Authorizing Provider  albuterol (PROVENTIL HFA;VENTOLIN HFA) 108 (90 Base) MCG/ACT inhaler Inhale 2 puffs into the lungs every 6 (six) hours as needed. 07/30/16   Colon Branch, MD  allopurinol (ZYLOPRIM) 100 MG tablet Take 1 tablet (100 mg total) by mouth daily. 02/15/17   Colon Branch, MD  amLODipine-benazepril (LOTREL) 5-40 MG capsule Take 1 capsule by mouth daily. 02/15/17   Colon Branch, MD  atorvastatin (LIPITOR) 10 MG tablet Take 1 tablet (10 mg total) by mouth daily. 02/15/17   Colon Branch, MD  hydrochlorothiazide (HYDRODIURIL) 25 MG tablet Take 1 tablet (25 mg total) by mouth daily. 02/15/17   Colon Branch, MD  mometasone [facility]) 220 MCG/INH inhaler Inhale 2 puffs into the lungs daily as needed. 07/30/16   Colon Branch, MD    Allergies    Patient has no known  allergies.  Review of Systems   Review of Systems  Constitutional:  Negative for chills, fatigue and fever.  Respiratory:  Negative for chest tightness and shortness of breath.   Cardiovascular:  Negative for chest pain and palpitations.  Musculoskeletal:  Negative for myalgias.  Skin:  Positive for color change and rash. Negative for wound.  Neurological:  Negative for weakness.   Physical Exam Updated Vital Signs BP (!) 162/91 (BP Location: Right Arm)   Pulse 92   Temp 99 F (37.2 C) (Oral)   Resp 18   Ht '5\' 10"'$  (1.778 m)   Wt 81.6 kg   SpO2 96%   BMI 25.83 kg/m   Physical Exam Vitals and nursing note reviewed.  Constitutional:      Appearance: Normal appearance.  HENT:     Head: Normocephalic and atraumatic.  Cardiovascular:     Rate and Rhythm: Normal rate and regular rhythm.  Pulmonary:     Effort: Pulmonary effort is normal. No respiratory distress.  Skin:    Capillary  Refill: Capillary refill takes less than 2 seconds.       Neurological:     General: No focal deficit present.     Mental Status: He is alert.     GCS: GCS eye subscore is 4. GCS verbal subscore is 5. GCS motor subscore is 6.     Sensory: Sensation is intact.     Motor: Motor function is intact.    ED Results / Procedures / Treatments   Labs (all labs ordered are listed, but only abnormal results are displayed) Labs Reviewed - No data to display  EKG None  Radiology No results found.  Procedures Procedures   Medications Ordered in ED Medications - No data to display  ED Course  I have reviewed the triage vital signs and the nursing notes.  Pertinent labs & imaging results that were available during my care of the patient were reviewed by me and considered in my medical decision making (see chart for details).    MDM Rules/Calculators/A&P                         10:29 PM  70 yo male presenting for leg pain and swelling. Patient is Aox3, no acute distress, afebrile, with stable vitals. Physical exam demonstrates warmth, swelling, and erythema circumferential right lower extremity without wounds concerning for cellulitis versus DVT.    Venous doppler demonstrates: Diffuse deep venous thrombosis involving the right common femoral, superficial femoral, popliteal and infrapopliteal veins.   Page sent to vascular surgery. Patient signed out to oncoming physician while awaiting recs.   Final Clinical Impression(s) / ED Diagnoses Final diagnoses:  Right leg pain  Right leg swelling  Acute deep vein thrombosis (DVT) of proximal vein of right lower extremity [facility])    Rx / DC Orders ED Discharge Orders     None        Lianne Cure, DO [id number]    Lianne Cure, DO [id number]

## 2021-04-18 ENCOUNTER — Encounter (HOSPITAL_COMMUNITY): Payer: Self-pay | Admitting: Family Medicine

## 2021-04-18 DIAGNOSIS — I10 Essential (primary) hypertension: Secondary | ICD-10-CM | POA: Diagnosis not present

## 2021-04-18 DIAGNOSIS — E785 Hyperlipidemia, unspecified: Secondary | ICD-10-CM

## 2021-04-18 DIAGNOSIS — J45909 Unspecified asthma, uncomplicated: Secondary | ICD-10-CM | POA: Diagnosis present

## 2021-04-18 DIAGNOSIS — N1832 Chronic kidney disease, stage 3b: Secondary | ICD-10-CM

## 2021-04-18 DIAGNOSIS — Z79899 Other long term (current) drug therapy: Secondary | ICD-10-CM | POA: Diagnosis not present

## 2021-04-18 DIAGNOSIS — Z8249 Family history of ischemic heart disease and other diseases of the circulatory system: Secondary | ICD-10-CM | POA: Diagnosis not present

## 2021-04-18 DIAGNOSIS — I82401 Acute embolism and thrombosis of unspecified deep veins of right lower extremity: Secondary | ICD-10-CM | POA: Diagnosis present

## 2021-04-18 DIAGNOSIS — N02B9 Other recurrent and persistent immunoglobulin A nephropathy: Secondary | ICD-10-CM

## 2021-04-18 DIAGNOSIS — Z880 Allergy status to penicillin: Secondary | ICD-10-CM | POA: Diagnosis not present

## 2021-04-18 DIAGNOSIS — I82411 Acute embolism and thrombosis of right femoral vein: Secondary | ICD-10-CM | POA: Diagnosis present

## 2021-04-18 DIAGNOSIS — Z87891 Personal history of nicotine dependence: Secondary | ICD-10-CM | POA: Diagnosis not present

## 2021-04-18 DIAGNOSIS — N028 Recurrent and persistent hematuria with other morphologic changes: Secondary | ICD-10-CM

## 2021-04-18 DIAGNOSIS — C61 Malignant neoplasm of prostate: Secondary | ICD-10-CM | POA: Diagnosis present

## 2021-04-18 DIAGNOSIS — Z20822 Contact with and (suspected) exposure to covid-19: Secondary | ICD-10-CM | POA: Diagnosis present

## 2021-04-18 DIAGNOSIS — I129 Hypertensive chronic kidney disease with stage 1 through stage 4 chronic kidney disease, or unspecified chronic kidney disease: Secondary | ICD-10-CM | POA: Diagnosis present

## 2021-04-18 DIAGNOSIS — M7989 Other specified soft tissue disorders: Secondary | ICD-10-CM | POA: Diagnosis present

## 2021-04-18 DIAGNOSIS — I82431 Acute embolism and thrombosis of right popliteal vein: Secondary | ICD-10-CM | POA: Diagnosis present

## 2021-04-18 HISTORY — DX: Other recurrent and persistent immunoglobulin A nephropathy: N02.B9

## 2021-04-18 HISTORY — DX: Recurrent and persistent hematuria with other morphologic changes: N02.8

## 2021-04-18 LAB — HIV ANTIBODY (ROUTINE TESTING W REFLEX): HIV Screen 4th Generation wRfx: NONREACTIVE

## 2021-04-18 LAB — CBC WITH DIFFERENTIAL/PLATELET
Abs Immature Granulocytes: 0.03 10*3/uL (ref 0.00–0.07)
Basophils Absolute: 0 10*3/uL (ref 0.0–0.1)
Basophils Relative: 1 %
Eosinophils Absolute: 0.3 10*3/uL (ref 0.0–0.5)
Eosinophils Relative: 4 %
HCT: 34 % — ABNORMAL LOW (ref 39.0–52.0)
Hemoglobin: 11 g/dL — ABNORMAL LOW (ref 13.0–17.0)
Immature Granulocytes: 0 %
Lymphocytes Relative: 22 %
Lymphs Abs: 1.9 10*3/uL (ref 0.7–4.0)
MCH: 31.2 pg (ref 26.0–34.0)
MCHC: 32.4 g/dL (ref 30.0–36.0)
MCV: 96.3 fL (ref 80.0–100.0)
Monocytes Absolute: 0.9 10*3/uL (ref 0.1–1.0)
Monocytes Relative: 11 %
Neutro Abs: 5.3 10*3/uL (ref 1.7–7.7)
Neutrophils Relative %: 62 %
Platelets: 251 10*3/uL (ref 150–400)
RBC: 3.53 MIL/uL — ABNORMAL LOW (ref 4.22–5.81)
RDW: 13.2 % (ref 11.5–15.5)
WBC: 8.5 10*3/uL (ref 4.0–10.5)
nRBC: 0 % (ref 0.0–0.2)

## 2021-04-18 LAB — COMPREHENSIVE METABOLIC PANEL
ALT: 17 U/L (ref 0–44)
AST: 16 U/L (ref 15–41)
Albumin: 2.6 g/dL — ABNORMAL LOW (ref 3.5–5.0)
Alkaline Phosphatase: 51 U/L (ref 38–126)
Anion gap: 6 (ref 5–15)
BUN: 33 mg/dL — ABNORMAL HIGH (ref 8–23)
CO2: 24 mmol/L (ref 22–32)
Calcium: 9.1 mg/dL (ref 8.9–10.3)
Chloride: 111 mmol/L (ref 98–111)
Creatinine, Ser: 2.4 mg/dL — ABNORMAL HIGH (ref 0.61–1.24)
GFR, Estimated: 29 mL/min — ABNORMAL LOW (ref >60)
Glucose, Bld: 92 mg/dL (ref 70–99)
Potassium: 4.3 mmol/L (ref 3.5–5.1)
Sodium: 141 mmol/L (ref 135–145)
Total Bilirubin: 0.5 mg/dL (ref 0.3–1.2)
Total Protein: 5.1 g/dL — ABNORMAL LOW (ref 6.5–8.1)

## 2021-04-18 LAB — RESP PANEL BY RT-PCR (FLU A&B, COVID) ARPGX2
Influenza A by PCR: NEGATIVE
Influenza B by PCR: NEGATIVE
SARS Coronavirus 2 by RT PCR: NEGATIVE

## 2021-04-18 LAB — C-REACTIVE PROTEIN: CRP: 1 mg/dL — ABNORMAL HIGH (ref <1.0)

## 2021-04-18 LAB — HEPARIN LEVEL (UNFRACTIONATED)
Heparin Unfractionated: 0.23 IU/mL — ABNORMAL LOW (ref 0.30–0.70)
Heparin Unfractionated: 0.26 IU/mL — ABNORMAL LOW (ref 0.30–0.70)

## 2021-04-18 MED ORDER — ALBUTEROL SULFATE (2.5 MG/3ML) 0.083% IN NEBU: 2.5000 mg | INHALATION_SOLUTION | Freq: Four times a day (QID) | RESPIRATORY_TRACT | Status: DC | PRN

## 2021-04-18 MED ORDER — SIMVASTATIN 20 MG PO TABS
10.0000 mg | ORAL_TABLET | Freq: Every day | ORAL | Status: DC
  Administered 2021-04-18 – 2021-04-19 (×2): 10 mg via ORAL
  Filled 2021-04-18 (×2): qty 1

## 2021-04-18 MED ORDER — DOCUSATE SODIUM 100 MG PO CAPS
100.0000 mg | ORAL_CAPSULE | Freq: Two times a day (BID) | ORAL | Status: DC
  Administered 2021-04-19: 100 mg via ORAL
  Filled 2021-04-18 (×2): qty 1

## 2021-04-18 MED ORDER — HEPARIN (PORCINE) 25000 UT/250ML-% IV SOLN
1550.0000 [IU]/h | INTRAVENOUS | Status: DC
  Administered 2021-04-18: 1400 [IU]/h via INTRAVENOUS
  Administered 2021-04-18: 1250 [IU]/h via INTRAVENOUS
  Administered 2021-04-19 – 2021-04-20 (×2): 1550 [IU]/h via INTRAVENOUS
  Filled 2021-04-18 (×5): qty 250

## 2021-04-18 MED ORDER — ATORVASTATIN CALCIUM 10 MG PO TABS: 10.0000 mg | ORAL_TABLET | Freq: Every day | ORAL | Status: DC

## 2021-04-18 MED ORDER — ALLOPURINOL 100 MG PO TABS
200.0000 mg | ORAL_TABLET | Freq: Every day | ORAL | Status: DC
  Administered 2021-04-18 – 2021-04-20 (×3): 200 mg via ORAL
  Filled 2021-04-18 (×3): qty 2

## 2021-04-18 MED ORDER — ACETAMINOPHEN 325 MG PO TABS
650.0000 mg | ORAL_TABLET | Freq: Four times a day (QID) | ORAL | Status: DC | PRN
  Administered 2021-04-18 – 2021-04-19 (×3): 650 mg via ORAL
  Filled 2021-04-18 (×3): qty 2

## 2021-04-18 MED ORDER — ONDANSETRON HCL 4 MG/2ML IJ SOLN: 4.0000 mg | Freq: Four times a day (QID) | INTRAMUSCULAR | Status: DC | PRN

## 2021-04-18 MED ORDER — HEPARIN BOLUS VIA INFUSION
1200.0000 [IU] | Freq: Once | INTRAVENOUS | Status: AC
  Administered 2021-04-18: 1200 [IU] via INTRAVENOUS
  Filled 2021-04-18: qty 1200

## 2021-04-18 MED ORDER — ONDANSETRON HCL 4 MG PO TABS: 4.0000 mg | ORAL_TABLET | Freq: Four times a day (QID) | ORAL | Status: DC | PRN

## 2021-04-18 MED ORDER — HEPARIN BOLUS VIA INFUSION
5000.0000 [IU] | Freq: Once | INTRAVENOUS | Status: AC
  Administered 2021-04-18: 5000 [IU] via INTRAVENOUS

## 2021-04-18 MED ORDER — HYDRALAZINE HCL 20 MG/ML IJ SOLN: 5.0000 mg | INTRAMUSCULAR | Status: DC | PRN

## 2021-04-18 MED ORDER — POLYETHYLENE GLYCOL 3350 17 G PO PACK: 17.0000 g | PACK | Freq: Every day | ORAL | Status: DC | PRN

## 2021-04-18 MED ORDER — SODIUM CHLORIDE 0.9% FLUSH
3.0000 mL | Freq: Two times a day (BID) | INTRAVENOUS | Status: DC
  Administered 2021-04-18 – 2021-04-20 (×3): 3 mL via INTRAVENOUS

## 2021-04-18 MED ORDER — AMLODIPINE BESYLATE 5 MG PO TABS: 5.0000 mg | ORAL_TABLET | Freq: Every day | ORAL | Status: DC

## 2021-04-18 MED ORDER — ALLOPURINOL 100 MG PO TABS: 100.0000 mg | ORAL_TABLET | Freq: Every day | ORAL | Status: DC

## 2021-04-18 MED ORDER — BISACODYL 5 MG PO TBEC: 5.0000 mg | DELAYED_RELEASE_TABLET | Freq: Every day | ORAL | Status: DC | PRN

## 2021-04-18 MED ORDER — OXYCODONE HCL 5 MG PO TABS: 5.0000 mg | ORAL_TABLET | ORAL | Status: DC | PRN

## 2021-04-18 MED ORDER — HYDROCHLOROTHIAZIDE 25 MG PO TABS: 25.0000 mg | ORAL_TABLET | Freq: Every day | ORAL | Status: DC

## 2021-04-18 MED ORDER — ACETAMINOPHEN 650 MG RE SUPP: 650.0000 mg | Freq: Four times a day (QID) | RECTAL | Status: DC | PRN

## 2021-04-18 MED ORDER — AMLODIPINE BESYLATE 10 MG PO TABS
10.0000 mg | ORAL_TABLET | Freq: Every day | ORAL | Status: DC
  Administered 2021-04-18 – 2021-04-20 (×3): 10 mg via ORAL
  Filled 2021-04-18 (×3): qty 1

## 2021-04-18 MED ORDER — METHOCARBAMOL 1000 MG/10ML IJ SOLN
500.0000 mg | Freq: Four times a day (QID) | INTRAVENOUS | Status: DC | PRN
  Filled 2021-04-18 (×2): qty 5

## 2021-04-18 MED ORDER — BENAZEPRIL HCL 5 MG PO TABS: 40.0000 mg | ORAL_TABLET | Freq: Every day | ORAL | Status: DC

## 2021-04-18 MED ORDER — BENAZEPRIL HCL 5 MG PO TABS
40.0000 mg | ORAL_TABLET | Freq: Every day | ORAL | Status: DC
  Administered 2021-04-18 – 2021-04-20 (×3): 40 mg via ORAL
  Filled 2021-04-18 (×3): qty 8

## 2021-04-18 NOTE — ED Provider Notes (Addendum)
I assumed care of this patient.  Please see previous provider note for further details of Hx, PE.  Briefly patient is a 70 y.o. male who presented RLE pain and swelling. [place] notable for large DVT from common femoral vein down. No signs of phlegmasia dolens at this time. Pending labs and consult to Vasc Sx.  Labs notable consistent with his CKD, though worse that prior with GFR 27.  Spoke with patient and he has a h/o IgA nephropathy and was recently diagnosed with "mild prostate cancer." He is no currently getting treatment for the prostate cancer.  He will require admission to optimize his anticoagulation. Spoke with Vasc Sx and patient is not a candidate for endovascular procedure. Medical management pursued. Admitted to medicine.       Fatima Blank, MD 04/18/21 [id number]

## 2021-04-18 NOTE — Progress Notes (Addendum)
ANTICOAGULATION CONSULT NOTE  Pharmacy Consult for Heparin  Indication: DVT  Allergies  Allergen Reactions   Amoxicillin Other (See Comments)    Kidney related issue    Patient Measurements: Height: '5\' 10"'$  (177.8 cm) Weight: 81.6 kg (180 lb) IBW/kg (Calculated) : 73  Vital Signs: Temp: 97.7 F (36.5 C) (08/26 1131) Temp Source: Oral (08/26 1131) BP: 154/93 (08/26 1131) Pulse Rate: 65 (08/26 1131)  Labs: Recent Labs    04/17/21 2155 04/18/21 0534 04/18/21 0950  HGB 11.1* 11.0*  --   HCT 34.8* 34.0*  --   PLT 274 251  --   HEPARINUNFRC  --   --  0.23*  CREATININE 2.50* 2.40*  --      Estimated Creatinine Clearance: 30.8 mL/min (A) (by C-G formula based on SCr of 2.4 mg/dL (H)).   Medical History: Past Medical History:  Diagnosis Date   Asthma    Hyperlipidemia    Hypertension    Kidney disease    Stage 3b; IgA nephropathy   Prostate cancer ([place])    watchful waiting   Spinal stenosis    C5-6 HNP     Assessment: 70 y/o M with new onset RLE DVT. Start heparin per pharmacy. Noted renal dysfunction. Patient is not on anticoagulation PTA.  Heparin level low this AM at 0.23. CBC stable. No bleeding or issues with infusion per discussion with RN.  Goal of Therapy:  Heparin level 0.3-0.7 units/ml Monitor platelets by anticoagulation protocol: Yes   Plan:  Heparin 1200 units BOLUS x 1 Start heparin drip to 1400 units/hr Check 6hr heparin level Monitor daily heparin level and CBC, s/sx bleeding F/u long-term anticoagulation plan   Arturo Morton, PharmD, BCPS Please check AMION for all [place] contact numbers Clinical Pharmacist 04/18/2021 12:08 PM

## 2021-04-18 NOTE — Progress Notes (Signed)
ANTICOAGULATION CONSULT NOTE - Initial Consult  Pharmacy Consult for Heparin  Indication: DVT  No Known Allergies  Patient Measurements: Height: '5\' 10"'$  (177.8 cm) Weight: 81.6 kg (180 lb) IBW/kg (Calculated) : 73  Vital Signs: Temp: 98.9 F (37.2 C) (08/26 0424) Temp Source: Oral (08/26 0424) BP: 180/97 (08/26 0424) Pulse Rate: 88 (08/26 0424)  Labs: Recent Labs    04/17/21 2155  HGB 11.1*  HCT 34.8*  PLT 274  CREATININE 2.50*    Estimated Creatinine Clearance: 29.6 mL/min (A) (by C-G formula based on SCr of 2.5 mg/dL (H)).   Medical History: Past Medical History:  Diagnosis Date   Asthma    Hyperlipidemia    Hypertension    Kidney disease    Stage 3   Spinal stenosis    C5-6 HNP     Assessment: 70 y/o M with new onset RLE DVT. Start heparin per pharmacy. Hgb 11.1. Noted renal dysfunction. PTA meds reviewed.   Goal of Therapy:  Heparin level 0.3-0.7 units/ml Monitor platelets by anticoagulation protocol: Yes   Plan:  Heparin 5000 units BOLUS Start heparin drip at 1250 units/hr 0900 Heparin level Daily CBC/Heparin level Monitor for bleeding  Narda Bonds, PharmD, BCPS Clinical Pharmacist Phone: [phone]

## 2021-04-18 NOTE — H&P (Signed)
History and Physical    Shawn Cooley BWG:[id number] DOB: 04/15/1951 DOA: 04/17/2021  PCP: Katherina Mires, MD Consultants:  Pablo Ledger - rad onc; Chinnasami - heme/onc; Col[place]onato - nephrology; Abner Greenspan - urology Patient coming from:  Home - lives with wife, Fraser Din; [place]: Wife, [phone]  Chief Complaint: R leg swelling  HPI: Shawn Cooley is a 70 y.o. 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  About a month ago, something happened in the back of his knee.  He saw his kidney doctor and sent him to ortho - ruptured Baker's cyst and there was nothing to do.  It got better and then got worse.  The swelling was acutely worse yesterday and they decided to come in.  The swelling had always been there and he suffered with the pain x 3 weeks and then the pain went away.  He worked hard yesterday doing physical labor, feeling better.  Now his lower back is hurting too.  No prior h/o blood clots.  +cough x 1 week, no SOB.  Cough is productive of green sputum, feels a little better today due to cough medicine.    He was diagnosed with prostate cancer a couple of months ago.  He is doing surveillance for now.  He has not had a recent PSA.    ED Course: [facility] transfer, per Dr. Myna Hidalgo:  Accepted from [facility] for DVT. 70 yr old male with CKD 3b, HTN, and recent prostate cancer diagnosis presenting with 3 wks progressive RLE swelling. He saw an NP in ortho clinic, was told this was likely a ruptured Baker cyst but continued to worsen. US reveals extensive RLE DVT. No phlegmasia or s/s of PE per ED physician. ED spoke with vascular who did not think pt is candidate for thrombolysis. IV heparin was started. COVID screen pending.   Review of Systems: As per HPI; otherwise review of systems reviewed and negative.   Ambulatory Status:  Ambulates without assistance  COVID Vaccine Status:   Complete  plus booster  Past Medical History:  Diagnosis Date   Asthma    Hyperlipidemia    Hypertension    IgA nephropathy 04/18/2021   Kidney disease    Stage 3b; IgA nephropathy   Prostate cancer ([place])    watchful waiting   Spinal stenosis    C5-6 HNP    Past Surgical History:  Procedure Laterality Date   CYST REMOVAL TRUNK  1970   pilonidal   RENAL BIOPSY     TONSILLECTOMY      Social History   Socioeconomic History   Marital status: Married    Spouse name: Not on file   Number of children: 2   Years of education: Not on file   Highest education level: Not on file  Occupational History   Occupation: Sales   Occupation: [employer]  Tobacco Use   Smoking status: Former    Types: Cigarettes    Quit date: 08/24/2006    Years since quitting: 14.6   Smokeless tobacco: Never   Tobacco comments:    still smokes , rarely, used to smoke more   Substance and Sexual Activity   Alcohol use: Yes    Alcohol/week: 14.0 standard drinks    Types: 14 Cans of beer per week    Comment: not daily, beer , occ 3-4 /day   Drug use: Yes    Types: Marijuana  Comment: occasional   Sexual activity: Not on file  Other Topics Concern   Not on file  Social History Narrative   Lives w/ wife   2 independent children    Social Determinants of Health   Financial Resource Strain: Not on file  Food Insecurity: Not on file  Transportation Needs: Not on file  Physical Activity: Not on file  Stress: Not on file  Social Connections: Not on file  Intimate Partner Violence: Not on file    Allergies  Allergen Reactions   Amoxicillin Other (See Comments)    Kidney related issue    Family History  Problem Relation Age of Onset   Stroke Paternal Aunt    Stroke Paternal Uncle    Cancer Paternal Aunt        ?   Hypertension Mother    Colon cancer Neg Hx    Prostate cancer Neg Hx    Diabetes Neg Hx     Prior to Admission medications   Medication Sig Start Date End Date Taking? Authorizing  Provider  albuterol (PROVENTIL HFA;VENTOLIN HFA) 108 (90 Base) MCG/ACT inhaler Inhale 2 puffs into the lungs every 6 (six) hours as needed. 07/30/16   Colon Branch, MD  allopurinol (ZYLOPRIM) 100 MG tablet Take 1 tablet (100 mg total) by mouth daily. 02/15/17   Colon Branch, MD  amLODipine-benazepril (LOTREL) 5-40 MG capsule Take 1 capsule by mouth daily. 02/15/17   Colon Branch, MD  atorvastatin (LIPITOR) 10 MG tablet Take 1 tablet (10 mg total) by mouth daily. 02/15/17   Colon Branch, MD  hydrochlorothiazide (HYDRODIURIL) 25 MG tablet Take 1 tablet (25 mg total) by mouth daily. 02/15/17   Colon Branch, MD  mometasone [facility]) 220 MCG/INH inhaler Inhale 2 puffs into the lungs daily as needed. 07/30/16   Colon Branch, MD    Physical Exam: Vitals:   04/18/21 0424 04/18/21 0904 04/18/21 1131 04/18/21 1639  BP: (!) 180/97 (!) 143/90 (!) 154/93 128/88  Pulse: 88 68 65 65  Resp: _0 Temp: 98.9 F (37.2 C) 98.3 F (36.8 C) 97.7 F (36.5 C) 98.6 F (37 C)  TempSrc: Oral Oral Oral Oral  SpO2: 100% 100% 98% 98%  Weight:      Height:         General:  Appears calm and comfortable and is in NAD Eyes:  EOMI, normal lids, iris ENT:  grossly normal hearing, lips & tongue, mmm Neck:  no LAD, masses or thyromegaly Cardiovascular:  RRR, no m/r/g.  Respiratory:   CTA bilaterally with no wheezes/rales/rhonchi.  Normal respiratory effort. Abdomen:  soft, NT, ND Skin:  no rash or induration seen on limited exam Musculoskeletal:  R calf is markedly larger than L but not warm or significantly painful to touch, no cords    Lower extremity:  No LE edema.  Limited foot exam with no ulcerations.  2+ distal pulses. Psychiatric:  grossly normal mood and affect, speech fluent and appropriate, AOx3 Neurologic:  CN 2-12 grossly intact, moves all extremities in coordinated fashion    Radiological Exams on Admission: Independently reviewed - see discussion in A/P where applicable  US Venous Img Lower  Unilateral Right (DVT)  Result Date: 04/17/2021 CLINICAL DATA:  Right lower extremity pain and swelling, history of ruptured Baker's cyst several weeks ago EXAM: RIGHT LOWER EXTREMITY VENOUS DOPPLER ULTRASOUND TECHNIQUE: Gray-scale sonography with graded compression, as well as color Doppler and duplex ultrasound were performed to evaluate  the lower extremity deep venous systems from the level of the common femoral vein and including the common femoral, femoral, profunda femoral, popliteal and calf veins including the posterior tibial, peroneal and gastrocnemius veins when visible. The superficial great saphenous vein was also interrogated. Spectral Doppler was utilized to evaluate flow at rest and with distal augmentation maneuvers in the common femoral, femoral and popliteal veins. COMPARISON:  None. FINDINGS: Contralateral Common Femoral Vein: Respiratory phasicity is normal and symmetric with the symptomatic side. No evidence of thrombus. Normal compressibility. Common Femoral Vein: Thrombus is noted with decreased compressibility. Saphenofemoral Junction: No evidence of thrombus. Normal compressibility and flow on color Doppler imaging. Profunda Femoral Vein: No evidence of thrombus. Normal compressibility and flow on color Doppler imaging. Femoral Vein: Thrombus is noted with decreased compressibility. Popliteal Vein: Thrombus is noted with decreased compressibility. Calf Veins: Thrombus is noted with decreased compressibility. Superficial Great Saphenous Vein: No evidence of thrombus. Normal compressibility. Venous Reflux:  None. Other Findings:  Calf edema is noted. IMPRESSION: Diffuse deep venous thrombosis involving the right common femoral, superficial femoral, popliteal and infrapopliteal veins. Electronically Signed   By: Inez Catalina M.D.   On: 04/17/2021 23:30    EKG: None   Labs on Admission: I have personally reviewed the available labs and imaging studies at the time of the  admission.  Pertinent labs:   BUN 34/Creatinine 2.50/GFR 27 -> 33/2.40/29 WBC 8.5 Hgb 11.1 ESR 19 COVID/flu negative   Assessment/Plan Principal Problem:   Acute deep vein thrombosis (DVT) of right lower extremity (HCC) Active Problems:   Dyslipidemia   Essential hypertension   Stage 3b chronic kidney disease (HCC)   IgA nephropathy   Prostate cancer (HCC)   DVT -Patient without prior episodes of thromboembolic disease presenting with new DVT -He appears to have had a ruptured Baker's cyst prior to this event with resultant decreased ambulation and he also has a sedentary job with lots of driving -Additionally, he has newly diagnosed prostate cancer (presumably confined to prostate) -Will admit on telemetry -Initiate anticoagulation - for now, will start treatment-dose heparin -He is likely able to transition to alternative [facility] agent tomorrow; patient reviewed by Dr. Lindi Adie who agrees that Eliquis is likely appropriate -He may need lifelong AC -We discussed oral AC treatment options and risks/benefits including frequent MD visits and dietary regulation with Coumadin vs. Significant expense with DOAC therapy -Will request TOC team consultation to assist with cost analysis of the various DOAC options based on his insurance -The patient understands that thromboembolic disease can be catastrophic and even deadly and that he must be complaint with physician appointments and recommended anticoagulation.  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 The patient has been fully  vaccinated against COVID-19.   Level of care: Telemetry Medical DVT prophylaxis: Heparin Code Status:  Full - confirmed with patient Family Communication: None present Disposition Plan:  The patient is from: home  Anticipated d/c is to: home without [facility] services   Anticipated d/c date will depend on clinical response to treatment, likely 2-3 days  Patient is currently: acutely ill Consults called:  Hematology and vascular surgery - telephone only; [place] team  Admission status:  Admit - It is my clinical opinion that admission to INPATIENT is reasonable and necessary because of the expectation that this patient will require hospital care that crosses at least 2 midnights to treat this condition based on the medical complexity of the problems presented.  Given the aforementioned information, the predictability of an adverse outcome is felt to be significant.    Karmen Bongo MD [facility]   How to contact the [facility] Attending or Consulting provider [place] or covering provider during after hours [place], for this patient?  Check the care team in [facility] and look for a) attending/consulting [facility] provider listed and b) the [facility] team listed Log into [email or web address] and use [place]'s universal password to access. If you do not have the password, please contact the hospital operator. Locate the [facility] provider you are looking for under [facility] and page to a number that you can be directly reached. If you still have difficulty reaching the provider, please page the [facility] (Director on Call) for the Hospitalists listed on amion for assistance.   04/18/2021, 7:00 PM

## 2021-04-18 NOTE — ED Notes (Signed)
[facility] called and notified.

## 2021-04-18 NOTE — Progress Notes (Signed)
While  having a conversation with patient he told me that he does drink a few beers HS and that his last drink was last night. He stated that he doesn't have any problems with withdrawals. I informed him that there are medications we can give if he feels like he may need them. Will continue to monitor. Arthor Captain LPN

## 2021-04-18 NOTE — Plan of Care (Signed)

## 2021-04-18 NOTE — Progress Notes (Addendum)
[facility] for IV Heparin  Indication: DVT  Allergies  Allergen Reactions   Amoxicillin Other (See Comments)    Kidney related issue    Patient Measurements: Height: '5\' 10"'$  (177.8 cm) Weight: 81.6 kg (180 lb) IBW/kg (Calculated) : 73 Heparin Dosing Weight:  81.6 kg  Vital Signs: Temp: 98.6 F (37 C) (08/26 1639) Temp Source: Oral (08/26 1639) BP: 128/88 (08/26 1639) Pulse Rate: 65 (08/26 1639)  Labs: Recent Labs    04/17/21 2155 04/18/21 0534 04/18/21 0950 04/18/21 1857  HGB 11.1* 11.0*  --   --   HCT 34.8* 34.0*  --   --   PLT 274 251  --   --   HEPARINUNFRC  --   --  0.23* 0.26*  CREATININE 2.50* 2.40*  --   --     Estimated Creatinine Clearance: 30.8 mL/min (A) (by C-G formula based on SCr of 2.4 mg/dL (H)).  Medical History: Past Medical History:  Diagnosis Date   Asthma    Hyperlipidemia    Hypertension    IgA nephropathy 04/18/2021   Kidney disease    Stage 3b; IgA nephropathy   Prostate cancer ([place])    watchful waiting   Spinal stenosis    C5-6 HNP    Assessment: 70 yr old male with CKD due to IgA nephropathy and recent dx of prostate cancer presented with new RLE DVT. Pharmacy was consulted to dose IV heparin. Pt was no on anticoagulant PTA.   Heparin level ~6 hrs after heparin 1200 units IV bolus X 1, followed by increasing heparin level to 1400 units/hr, was 0.26 units/ml, which is below the goal range for this pt. H/H 11.0/34.0, plt 251. Per RN, no issues with IV or bleeding observed.  Goal of Therapy:  Heparin level 0.3-0.7 units/ml Monitor platelets by anticoagulation protocol: Yes   Plan:  Heparin 1200 units IV bolus X 1 Increase heparin infusion to 1550 units/hr Check 6-hr heparin level Monitor daily heparin level and CBC, s/sx bleeding F/U long-term anticoagulation plan  Gillermina Hu, PharmD, BCPS, [facility] Clinical Pharmacist 04/18/2021 7:44 PM

## 2021-04-18 NOTE — ED Notes (Signed)
Pt. States do not call his wife at this hour of the morning to let her know what room he is going to.

## 2021-04-19 LAB — CBC
HCT: 34.2 % — ABNORMAL LOW (ref 39.0–52.0)
Hemoglobin: 11.1 g/dL — ABNORMAL LOW (ref 13.0–17.0)
MCH: 31.3 pg (ref 26.0–34.0)
MCHC: 32.5 g/dL (ref 30.0–36.0)
MCV: 96.3 fL (ref 80.0–100.0)
Platelets: 236 10*3/uL (ref 150–400)
RBC: 3.55 MIL/uL — ABNORMAL LOW (ref 4.22–5.81)
RDW: 13.2 % (ref 11.5–15.5)
WBC: 8.2 10*3/uL (ref 4.0–10.5)
nRBC: 0 % (ref 0.0–0.2)

## 2021-04-19 LAB — BASIC METABOLIC PANEL
Anion gap: 3 — ABNORMAL LOW (ref 5–15)
BUN: 31 mg/dL — ABNORMAL HIGH (ref 8–23)
CO2: 26 mmol/L (ref 22–32)
Calcium: 9 mg/dL (ref 8.9–10.3)
Chloride: 111 mmol/L (ref 98–111)
Creatinine, Ser: 2.19 mg/dL — ABNORMAL HIGH (ref 0.61–1.24)
GFR, Estimated: 32 mL/min — ABNORMAL LOW (ref >60)
Glucose, Bld: 96 mg/dL (ref 70–99)
Potassium: 4.2 mmol/L (ref 3.5–5.1)
Sodium: 140 mmol/L (ref 135–145)

## 2021-04-19 LAB — HEPARIN LEVEL (UNFRACTIONATED)
Heparin Unfractionated: 0.62 IU/mL (ref 0.30–0.70)
Heparin Unfractionated: 0.54 IU/mL (ref 0.30–0.70)

## 2021-04-19 NOTE — Progress Notes (Signed)
ANTICOAGULATION CONSULT NOTE   Pharmacy Consult for Heparin  Indication: DVT  Allergies  Allergen Reactions   Amoxicillin Other (See Comments)    Kidney related issue    Patient Measurements: Height: '5\' 10"'$  (177.8 cm) Weight: 81.6 kg (180 lb) IBW/kg (Calculated) : 73  Vital Signs: Temp: 98.9 F (37.2 C) (08/27 0027) Temp Source: Oral (08/27 0027) BP: 127/65 (08/27 0027) Pulse Rate: 68 (08/27 0027)  Labs: Recent Labs    04/17/21 2155 04/18/21 0534 04/18/21 0950 04/18/21 1857 04/19/21 0134  HGB 11.1* 11.0*  --   --  11.1*  HCT 34.8* 34.0*  --   --  34.2*  PLT 274 251  --   --  236  HEPARINUNFRC  --   --  0.23* 0.26* 0.62  CREATININE 2.50* 2.40*  --   --   --      Estimated Creatinine Clearance: 30.8 mL/min (A) (by C-G formula based on SCr of 2.4 mg/dL (H)).   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 Start heparin per pharmacy. Hgb 11.1. Noted renal dysfunction. PTA meds reviewed.   8/27 AM update:  Heparin level therapeutic after rate increase  Goal of Therapy:  Heparin level 0.3-0.7 units/ml Monitor platelets by anticoagulation protocol: Yes   Plan:  Cont heparin 1550 units/hr 1200 heparin level  Narda Bonds, PharmD, BCPS Clinical Pharmacist Phone: [phone]

## 2021-04-19 NOTE — Progress Notes (Signed)
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  He also reports swollen productive cough that is getting better.  Patient was found to have extensive right lower extremity DVT.  No phlegmasia or signs of PE.  The spoke with vascular who did not think patient is a candidate for thrombolysis.  Patient was a started on IV heparin.     Assessment & Plan:   Principal Problem:   Acute deep vein thrombosis (DVT) of right lower extremity (HCC) Active Problems:   Dyslipidemia   Essential hypertension   Stage 3b chronic kidney disease (HCC)   IgA nephropathy   Prostate cancer (HCC)  1-Extensive DVT: right Doppler: Showed diffuse deep vein thrombosis involving the right common femoral, superficial femoral, popliteal and infrapopliteal veins. -In the setting of decreased ambulation in the event of recent ruptured Baker's cyst.  History of prostate cancer -Swelling improving, plan to continue with IV heparin for another 24 hours. -Plan  to transition to Eliquis tomorrow  2-Stage IIIb CKD with associated IgA nephropathy: -Continue with ACE. -Newly started on Farxiga, hold while inpatient -Creatinine stable 2.1  3-Hypertension: Continue with Lotensin and Norvasc  Hyperlipidemia: Continue with Zocor  Prostate  cancer: Watchful and waiting for now PSA:      Estimated body mass index is 25.83 kg/m as calculated from the following:   Height as of this encounter: '5\' 10"'$  (1.778 m).   Weight as of this encounter: 81.6 kg.   DVT prophylaxis: Heparin Gtt Code Status: Full Code Family Communication: care discussed with patient.  And wife who was at bedside Disposition Plan:  Status is: Inpatient  Remains  inpatient appropriate because:IV treatments appropriate due to intensity of illness or inability to take PO  Dispo: The patient is from: Home              Anticipated d/c is to: Home              Patient currently is not medically stable to d/c.   Difficult to place patient No        Consultants:  None  Procedures:  Doppler  Antimicrobials:    Subjective: Reported improvement of right lower extremity edema.   Objective: Vitals:   04/19/21 0027 04/19/21 0438 04/19/21 0733 04/19/21 1118  BP: 127/65 121/70 134/84 124/79  Pulse: 68 67 72 65  Resp:   17 18  Temp: 98.9 F (37.2 C) 98.8 F (37.1 C) 98.3 F (36.8 C) 98.4 F (36.9 C)  TempSrc: Oral Oral Oral Oral  SpO2: 94% 92% 97% 96%  Weight:      Height:        Intake/Output Summary (Last 24 hours) at 04/19/2021 1457 Last data filed at 04/19/2021 0900 Gross per 24 hour  Intake 602.49 ml  Output --  Net 602.49 ml   Filed Weights   04/17/21 2100  Weight: 81.6 kg    Examination:  General exam: Appears calm and comfortable  Respiratory system: Clear to auscultation. Respiratory effort normal. Cardiovascular system: S1 & S2 heard, RRR. No JVD, murmurs, rubs, gallops or clicks. No pedal edema. Gastrointestinal system: Abdomen is nondistended, soft and nontender.  Central nervous system: Alert and oriented. No focal neurological deficits. Extremities:  right LE with edema   Data Reviewed:  I have personally reviewed following labs and imaging studies  CBC: Recent Labs  Lab 04/17/21 2155 04/18/21 0534 04/19/21 0134  WBC 8.5 8.5 8.2  NEUTROABS 4.8 5.3  --   HGB 11.1* 11.0* 11.1*  HCT 34.8* 34.0* 34.2*  MCV 95.6 96.3 96.3  PLT 274 251 [id number]   Basic Metabolic Panel: Recent Labs  Lab 04/17/21 2155 04/18/21 0534 04/19/21 0134  NA 143 141 140  K 4.1 4.3 4.2  CL 107 111 111  CO2 '28 24 26  '$ GLUCOSE 105* 92 96  BUN 34* 33* 31*  CREATININE 2.50* 2.40* 2.19*  CALCIUM 9.3 9.1 9.0   GFR: Estimated  Creatinine Clearance: 33.8 mL/min (A) (by C-G formula based on SCr of 2.19 mg/dL (H)). Liver Function Tests: Recent Labs  Lab 04/18/21 0534  AST 16  ALT 17  ALKPHOS 51  BILITOT 0.5  PROT 5.1*  ALBUMIN 2.6*   No results for input(s): LIPASE, AMYLASE in the last 168 hours. No results for input(s): AMMONIA in the last 168 hours. Coagulation Profile: No results for input(s): INR, PROTIME in the last 168 hours. Cardiac Enzymes: No results for input(s): CKTOTAL, CKMB, CKMBINDEX, TROPONINI in the last 168 hours. BNP (last 3 results) No results for input(s): PROBNP in the last 8760 hours. HbA1C: No results for input(s): HGBA1C in the last 72 hours. CBG: No results for input(s): GLUCAP in the last 168 hours. Lipid Profile: No results for input(s): CHOL, HDL, LDLCALC, TRIG, CHOLHDL, LDLDIRECT in the last 72 hours. Thyroid Function Tests: No results for input(s): TSH, T4TOTAL, FREET4, T3FREE, THYROIDAB in the last 72 hours. Anemia Panel: No results for input(s): VITAMINB12, FOLATE, FERRITIN, TIBC, IRON, RETICCTPCT in the last 72 hours. Sepsis Labs: No results for input(s): PROCALCITON, LATICACIDVEN in the last 168 hours.  Recent Results (from the past 240 hour(s))  Resp Panel by RT-PCR (Flu A&B, Covid) Nasopharyngeal Swab     Status: None   Collection Time: 04/18/21 12:40 AM   Specimen: Nasopharyngeal Swab; Nasopharyngeal(NP) swabs in vial transport medium  Result Value Ref Range Status   SARS Coronavirus 2 by RT PCR NEGATIVE NEGATIVE Final    Comment: (NOTE) SARS-CoV-2 target nucleic acids are NOT DETECTED.  The SARS-CoV-2 RNA is generally detectable in upper respiratory specimens during the acute phase of infection. The lowest concentration of SARS-CoV-2 viral copies this assay can detect is 138 copies/mL. A negative result does not preclude SARS-Cov-2 infection and should not be used as the sole basis for treatment or other patient management decisions. A negative result may  occur with  improper specimen collection/handling, submission of specimen other than nasopharyngeal swab, presence of viral mutation(s) within the areas targeted by this assay, and inadequate number of viral copies(<138 copies/mL). A negative result must be combined with clinical observations, patient history, and epidemiological information. The expected result is Negative.  Fact Sheet for Patients:  EntrepreneurPulse.com.au  Fact Sheet for Healthcare Providers:  IncredibleEmployment.be  This test is no t yet approved or cleared by the [place] FDA and  has been authorized for detection and/or diagnosis of SARS-CoV-2 by FDA under an Emergency Use Authorization (EUA). This EUA will remain  in effect (meaning this test can be used) for the duration of the COVID-19 declaration under Section 564(b)(1) of the Act, 21 U.S.C.section 360bbb-3(b)(1), unless the authorization is terminated  or revoked sooner.       Influenza A by PCR NEGATIVE NEGATIVE Final   Influenza B by PCR NEGATIVE NEGATIVE Final    Comment: (  NOTE) The Xpert Xpress SARS-CoV-2/FLU/RSV plus assay is intended as an aid in the diagnosis of influenza from Nasopharyngeal swab specimens and should not be used as a sole basis for treatment. Nasal washings and aspirates are unacceptable for Xpert Xpress SARS-CoV-2/FLU/RSV testing.  Fact Sheet for Patients: EntrepreneurPulse.com.au  Fact Sheet for Healthcare Providers: IncredibleEmployment.be  This test is not yet approved or cleared by the [place] FDA and has been authorized for detection and/or diagnosis of SARS-CoV-2 by FDA under an Emergency Use Authorization (EUA). This EUA will remain in effect (meaning this test can be used) for the duration of the COVID-19 declaration under Section 564(b)(1) of the Act, 21 U.S.C. section 360bbb-3(b)(1), unless the authorization is terminated  or revoked.  Performed at [facility], [address] [place], [place] [address]          Radiology Studies: US Venous Img Lower Unilateral Right (DVT)  Result Date: 04/17/2021 CLINICAL DATA:  Right lower extremity pain and swelling, history of ruptured Baker's cyst several weeks ago EXAM: RIGHT LOWER EXTREMITY VENOUS DOPPLER ULTRASOUND TECHNIQUE: Gray-scale sonography with graded compression, as well as color Doppler and duplex ultrasound were performed to evaluate the lower extremity deep venous systems from the level of the common femoral vein and including the common femoral, femoral, profunda femoral, popliteal and calf veins including the posterior tibial, peroneal and gastrocnemius veins when visible. The superficial great saphenous vein was also interrogated. Spectral Doppler was utilized to evaluate flow at rest and with distal augmentation maneuvers in the common femoral, femoral and popliteal veins. COMPARISON:  None. FINDINGS: Contralateral Common Femoral Vein: Respiratory phasicity is normal and symmetric with the symptomatic side. No evidence of thrombus. Normal compressibility. Common Femoral Vein: Thrombus is noted with decreased compressibility. Saphenofemoral Junction: No evidence of thrombus. Normal compressibility and flow on color Doppler imaging. Profunda Femoral Vein: No evidence of thrombus. Normal compressibility and flow on color Doppler imaging. Femoral Vein: Thrombus is noted with decreased compressibility. Popliteal Vein: Thrombus is noted with decreased compressibility. Calf Veins: Thrombus is noted with decreased compressibility. Superficial Great Saphenous Vein: No evidence of thrombus. Normal compressibility. Venous Reflux:  None. Other Findings:  Calf edema is noted. IMPRESSION: Diffuse deep venous thrombosis involving the right common femoral, superficial femoral, popliteal and infrapopliteal veins. Electronically Signed   By: Inez Catalina M.D.    On: 04/17/2021 23:30        Scheduled Meds:  allopurinol  200 mg Oral Daily   amLODipine  10 mg Oral Daily   And   benazepril  40 mg Oral Daily   docusate sodium  100 mg Oral BID   simvastatin  10 mg Oral q1800   sodium chloride flush  3 mL Intravenous Q12H   Continuous Infusions:  heparin 1,550 Units/hr (04/19/21 0823)   methocarbamol (ROBAXIN) IV       LOS: 1 day    Time spent : 35 minutes.     Elmarie Shiley, MD [facility]   If 7PM-7AM, please contact night-coverage [email or web address]  04/19/2021, 2:57 PM

## 2021-04-19 NOTE — Progress Notes (Signed)
ANTICOAGULATION CONSULT NOTE  Pharmacy Consult for IV Heparin  Indication: DVT  Allergies  Allergen Reactions   Amoxicillin Other (See Comments)    Kidney related issue    Patient Measurements: Height: '5\' 10"'$  (177.8 cm) Weight: 81.6 kg (180 lb) IBW/kg (Calculated) : 73 Heparin Dosing Weight:  81.6 kg  Vital Signs: Temp: 98.4 F (36.9 C) (08/27 1118) Temp Source: Oral (08/27 1118) BP: 124/79 (08/27 1118) Pulse Rate: 65 (08/27 1118)  Labs: Recent Labs    04/17/21 2155 04/18/21 0534 04/18/21 0950 04/18/21 1857 04/19/21 0134 04/19/21 1154  HGB 11.1* 11.0*  --   --  11.1*  --   HCT 34.8* 34.0*  --   --  34.2*  --   PLT 274 251  --   --  236  --   HEPARINUNFRC  --   --    < > 0.26* 0.62 0.54  CREATININE 2.50* 2.40*  --   --  2.19*  --    < > = values in this interval not displayed.    Estimated Creatinine Clearance: 33.8 mL/min (A) (by C-G formula based on SCr of 2.19 mg/dL (H)).  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 Pharmacy was consulted to dose IV heparin. Pt was no on anticoagulant PTA.   Repeat Heparin level therapeutic at 0.54. Per notes, no evidence of bleeding. Hgb and plt stable.   Goal of Therapy:  Heparin level 0.3-0.7 units/ml Monitor platelets by anticoagulation protocol: Yes   Plan:  Continue Heparin infusion at 1550 units/hr.  F/u am heparin level.  Monitor daily heparin level and CBC, s/sx bleeding F/U long-term anticoagulation plan  Lovena Le E. [place], Stage manager

## 2021-04-20 LAB — BASIC METABOLIC PANEL
Anion gap: 4 — ABNORMAL LOW (ref 5–15)
BUN: 29 mg/dL — ABNORMAL HIGH (ref 8–23)
CO2: 27 mmol/L (ref 22–32)
Calcium: 9.3 mg/dL (ref 8.9–10.3)
Chloride: 111 mmol/L (ref 98–111)
Creatinine, Ser: 2.05 mg/dL — ABNORMAL HIGH (ref 0.61–1.24)
GFR, Estimated: 35 mL/min — ABNORMAL LOW (ref >60)
Glucose, Bld: 98 mg/dL (ref 70–99)
Potassium: 4.5 mmol/L (ref 3.5–5.1)
Sodium: 142 mmol/L (ref 135–145)

## 2021-04-20 LAB — CBC
HCT: 36.1 % — ABNORMAL LOW (ref 39.0–52.0)
Hemoglobin: 11.7 g/dL — ABNORMAL LOW (ref 13.0–17.0)
MCH: 31.1 pg (ref 26.0–34.0)
MCHC: 32.4 g/dL (ref 30.0–36.0)
MCV: 96 fL (ref 80.0–100.0)
Platelets: 275 10*3/uL (ref 150–400)
RBC: 3.76 MIL/uL — ABNORMAL LOW (ref 4.22–5.81)
RDW: 13.2 % (ref 11.5–15.5)
WBC: 7.2 10*3/uL (ref 4.0–10.5)
nRBC: 0 % (ref 0.0–0.2)

## 2021-04-20 LAB — VITAMIN B12: Vitamin B-12: 178 pg/mL — ABNORMAL LOW (ref 180–914)

## 2021-04-20 LAB — HEPARIN LEVEL (UNFRACTIONATED): Heparin Unfractionated: 0.65 IU/mL (ref 0.30–0.70)

## 2021-04-20 MED ORDER — APIXABAN 5 MG PO TABS: 5.0000 mg | ORAL_TABLET | Freq: Two times a day (BID) | ORAL | Status: DC

## 2021-04-20 MED ORDER — APIXABAN 5 MG PO TABS: 5.0000 mg | ORAL_TABLET | Freq: Two times a day (BID) | ORAL | 3 refills | Status: DC

## 2021-04-20 MED ORDER — APIXABAN 5 MG PO TABS: 10.0000 mg | ORAL_TABLET | Freq: Two times a day (BID) | ORAL | 0 refills | Status: DC

## 2021-04-20 MED ORDER — APIXABAN 5 MG PO TABS
10.0000 mg | ORAL_TABLET | Freq: Two times a day (BID) | ORAL | Status: DC
  Administered 2021-04-20: 10 mg via ORAL
  Filled 2021-04-20: qty 2

## 2021-04-20 MED ORDER — DOCUSATE SODIUM 100 MG PO CAPS: 100.0000 mg | ORAL_CAPSULE | Freq: Two times a day (BID) | ORAL | 0 refills | Status: AC

## 2021-04-20 NOTE — Progress Notes (Signed)
Patient received medication coupon from case manager Elayne Snare. Patient discharged to home with wife via wheelchair with all belongings.

## 2021-04-20 NOTE — Progress Notes (Signed)
Discharge instructions given to patient. Patient verbalized understanding of all teaching. IV removed before discharge. Patient discharged to home with wife via wheelchair with all belongings.

## 2021-04-20 NOTE — Progress Notes (Signed)
[place] for Transition from IV Heparin to Apixaban Indication: DVT  Allergies  Allergen Reactions   Amoxicillin Other (See Comments)    Kidney related issue    Patient Measurements: Height: '5\' 10"'$  (177.8 cm) Weight: 81.6 kg (180 lb) IBW/kg (Calculated) : 73  Vital Signs: Temp: 98.4 F (36.9 C) (08/28 0609) Temp Source: Oral (08/28 0609) BP: 120/75 (08/28 0609) Pulse Rate: 63 (08/28 0609)  Labs: Recent Labs    04/18/21 0534 04/18/21 0950 04/19/21 0134 04/19/21 1154 04/20/21 0743  HGB 11.0*  --  11.1*  --  11.7*  HCT 34.0*  --  34.2*  --  36.1*  PLT 251  --  236  --  275  HEPARINUNFRC  --    < > 0.62 0.54 0.65  CREATININE 2.40*  --  2.19*  --  2.05*   < > = values in this interval not displayed.    Estimated Creatinine Clearance: 36.1 mL/min (A) (by C-G formula based on SCr of 2.05 mg/dL (H)).  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 Pharmacy was consulted to dose IV heparin. Pt was no on anticoagulant PTA.   Heparin level therapeutic at 0.65. Hgb and plt stable. Per MD, will transition pt to apixaban today.   Goal of Therapy:  Monitor platelets by anticoagulation protocol: Yes   Plan:  D/c Heparin infusion.  Start Apixaban 10 mg PO BID x 7 days, then 5 mg BID.  Monitor for s/sx of bleeding.    Marzella Schlein [place], Stage manager

## 2021-04-20 NOTE — Progress Notes (Signed)
Patient currently waiting in room for a medication coupon that will be delivered by his case manager

## 2021-04-20 NOTE — Discharge Summary (Signed)
Physician Discharge Summary  Shawn Cooley [id number] DOB: Apr 20, 1951 DOA: 04/17/2021  PCP: Katherina Mires, MD  Admit date: 04/17/2021 Discharge date: 04/20/2021  Admitted From: Home  Disposition:  Home  Recommendations for Outpatient Follow-up:  Follow up with PCP in 1-2 weeks Please obtain BMP/CBC in one week Needs to follow up with Nephrologist to monitor renal function.  Needs to follow up with Vascular in 1 week to follow up on DVT Follow B 12 and PSA results.     Discharge Condition: Stable.  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  He also reports swollen productive cough that is getting better.  Patient was found to have extensive right lower extremity DVT.  No phlegmasia or signs of PE.  The spoke with vascular who did not think patient is a candidate for thrombolysis.  Patient was a started on IV heparin.  1-Extensive DVT: right Doppler: Showed diffuse deep vein thrombosis involving the right common femoral, superficial femoral, popliteal and infrapopliteal veins. -In the setting of decreased ambulation in the event of recent ruptured Baker's cyst.  History of prostate cancer -Swelling improving, he was on Heparin Gtt for 48 hours.  -He was transition to [facility] 8/28. Plan to discharge home today.    2-Stage IIIb CKD with associated IgA nephropathy: -Continue with ACE. -Newly started on Farxiga, hold while inpatient -Creatinine stable 2.1 stable.  Resume Farxiga at discharge.    3-Hypertension: Continue with Lotensin and Norvasc   Hyperlipidemia: Continue with Zocor   Prostate  cancer: Watchful and waiting for now PSA: pending.       Discharge Diagnoses:  Principal Problem:   Acute deep vein thrombosis (DVT) of right lower  extremity (HCC) Active Problems:   Dyslipidemia   Essential hypertension   Stage 3b chronic kidney disease ([place])   IgA nephropathy   Prostate cancer [facility])    Discharge Instructions  Discharge Instructions     Diet - low sodium heart healthy   Complete by: As directed    Increase activity slowly   Complete by: As directed       Allergies as of 04/20/2021       Reactions   Amoxicillin Other (See Comments)   Kidney related issue        Medication List     STOP taking these medications    atorvastatin 10 MG tablet Commonly known as: LIPITOR   hydrochlorothiazide 25 MG tablet Commonly known as: HYDRODIURIL       TAKE these medications    albuterol 108 (90 Base) MCG/ACT inhaler Commonly known as: VENTOLIN HFA Inhale 2 puffs into the lungs every 6 (six) hours as needed. What changed: reasons to take this   allopurinol 100 MG tablet Commonly known as: ZYLOPRIM Take 1 tablet (100 mg total) by mouth daily. What changed: how much to take   amLODipine-benazepril 10-40 MG capsule Commonly known as: LOTREL Take 1 capsule by mouth daily. What changed: Another medication with the same name was removed. Continue taking this medication, and follow the directions you see here.   apixaban 5 MG Tabs tablet Commonly known as: ELIQUIS Take 2 tablets (10 mg total) by mouth 2 (two) times daily for 7 days.   apixaban 5 MG Tabs tablet Commonly known as: ELIQUIS Take 1 tablet (5 mg total) by mouth 2 (two) times  daily. Start taking on: April 27, 2021   colchicine 0.6 MG tablet Take 0.6 mg by mouth daily as needed (gout pain).   dapagliflozin propanediol 10 MG Tabs tablet Commonly known as: FARXIGA Take 10 mg by mouth daily. Dr. Hanley Seamen samples   docusate sodium 100 MG capsule Commonly known as: COLACE Take 1 capsule (100 mg total) by mouth 2 (two) times daily.   mometasone 220 MCG/INH inhaler Commonly known as: ASMANEX Inhale 2 puffs into the lungs daily as  needed.   simvastatin 10 MG tablet Commonly known as: ZOCOR Take 10 mg by mouth daily.        Follow-up Information     Marty Heck, MD. Call in 1 week(s).   Specialty: Vascular Surgery Contact information: [address] [place] [address] [phone]         Katherina Mires, MD Follow up in 1 week(s).   Specialty: Family Medicine Contact information: [place] [address] [phone]                Allergies  Allergen Reactions   Amoxicillin Other (See Comments)    Kidney related issue    Consultations: None   Procedures/Studies: US Venous Img Lower Unilateral Right (DVT)  Result Date: 04/17/2021 CLINICAL DATA:  Right lower extremity pain and swelling, history of ruptured Baker's cyst several weeks ago EXAM: RIGHT LOWER EXTREMITY VENOUS DOPPLER ULTRASOUND TECHNIQUE: Gray-scale sonography with graded compression, as well as color Doppler and duplex ultrasound were performed to evaluate the lower extremity deep venous systems from the level of the common femoral vein and including the common femoral, femoral, profunda femoral, popliteal and calf veins including the posterior tibial, peroneal and gastrocnemius veins when visible. The superficial great saphenous vein was also interrogated. Spectral Doppler was utilized to evaluate flow at rest and with distal augmentation maneuvers in the common femoral, femoral and popliteal veins. COMPARISON:  None. FINDINGS: Contralateral Common Femoral Vein: Respiratory phasicity is normal and symmetric with the symptomatic side. No evidence of thrombus. Normal compressibility. Common Femoral Vein: Thrombus is noted with decreased compressibility. Saphenofemoral Junction: No evidence of thrombus. Normal compressibility and flow on color Doppler imaging. Profunda Femoral Vein: No evidence of thrombus. Normal compressibility and flow on color Doppler imaging. Femoral Vein: Thrombus is noted  with decreased compressibility. Popliteal Vein: Thrombus is noted with decreased compressibility. Calf Veins: Thrombus is noted with decreased compressibility. Superficial Great Saphenous Vein: No evidence of thrombus. Normal compressibility. Venous Reflux:  None. Other Findings:  Calf edema is noted. IMPRESSION: Diffuse deep venous thrombosis involving the right common femoral, superficial femoral, popliteal and infrapopliteal veins. Electronically Signed   By: Inez Catalina M.D.   On: 04/17/2021 23:30   XR Knee 1-2 Views Right  Result Date: 03/26/2021 The rest of the right knee showed moderate medial joint space narrowing this is equal on the left.  There is no acute finding.    Subjective: He report mild numbness and tingling in his toes. Getting better. Has god pulse.  Right LE edema improved.   Discharge Exam: Vitals:   04/19/21 2033 04/20/21 0609  BP: 121/73 120/75  Pulse: 65 63  Resp: 18 16  Temp: 98.4 F (36.9 C) 98.4 F (36.9 C)  SpO2: 96% 97%     General: Pt is alert, awake, not in acute distress Cardiovascular: RRR, S1/S2 +, no rubs, no gallops Respiratory: CTA bilaterally, no wheezing, no rhonchi Abdominal: Soft, NT, ND, bowel sounds + Extremities: no  edema, no cyanosis    The results of significant diagnostics from this hospitalization (including imaging, microbiology, ancillary and laboratory) are listed below for reference.     Microbiology: Recent Results (from the past 240 hour(s))  Resp Panel by RT-PCR (Flu A&B, Covid) Nasopharyngeal Swab     Status: None   Collection Time: 04/18/21 12:40 AM   Specimen: Nasopharyngeal Swab; Nasopharyngeal(NP) swabs in vial transport medium  Result Value Ref Range Status   SARS Coronavirus 2 by RT PCR NEGATIVE NEGATIVE Final    Comment: (NOTE) SARS-CoV-2 target nucleic acids are NOT DETECTED.  The SARS-CoV-2 RNA is generally detectable in upper respiratory specimens during the acute phase of infection. The  lowest concentration of SARS-CoV-2 viral copies this assay can detect is 138 copies/mL. A negative result does not preclude SARS-Cov-2 infection and should not be used as the sole basis for treatment or other patient management decisions. A negative result may occur with  improper specimen collection/handling, submission of specimen other than nasopharyngeal swab, presence of viral mutation(s) within the areas targeted by this assay, and inadequate number of viral copies(<138 copies/mL). A negative result must be combined with clinical observations, patient history, and epidemiological information. The expected result is Negative.  Fact Sheet for Patients:  EntrepreneurPulse.com.au  Fact Sheet for Healthcare Providers:  IncredibleEmployment.be  This test is no t yet approved or cleared by the [place] FDA and  has been authorized for detection and/or diagnosis of SARS-CoV-2 by FDA under an Emergency Use Authorization (EUA). This EUA will remain  in effect (meaning this test can be used) for the duration of the COVID-19 declaration under Section 564(b)(1) of the Act, 21 U.S.C.section 360bbb-3(b)(1), unless the authorization is terminated  or revoked sooner.       Influenza A by PCR NEGATIVE NEGATIVE Final   Influenza B by PCR NEGATIVE NEGATIVE Final    Comment: (NOTE) The Xpert Xpress SARS-CoV-2/FLU/RSV plus assay is intended as an aid in the diagnosis of influenza from Nasopharyngeal swab specimens and should not be used as a sole basis for treatment. Nasal washings and aspirates are unacceptable for Xpert Xpress SARS-CoV-2/FLU/RSV testing.  Fact Sheet for Patients: EntrepreneurPulse.com.au  Fact Sheet for Healthcare Providers: IncredibleEmployment.be  This test is not yet approved or cleared by the [place] FDA and has been authorized for detection and/or diagnosis of SARS-CoV-2 by FDA under  an Emergency Use Authorization (EUA). This EUA will remain in effect (meaning this test can be used) for the duration of the COVID-19 declaration under Section 564(b)(1) of the Act, 21 U.S.C. section 360bbb-3(b)(1), unless the authorization is terminated or revoked.  Performed at [facility], [address], [place], [place] [address]      Labs: BNP (last 3 results) No results for input(s): BNP in the last 8760 hours. Basic Metabolic Panel: Recent Labs  Lab 04/17/21 2155 04/18/21 0534 04/19/21 0134 04/20/21 0743  NA 143 141 140 142  K 4.1 4.3 4.2 4.5  CL 107 111 111 111  CO2 '28 24 26 27  '$ GLUCOSE 105* 92 96 98  BUN 34* 33* 31* 29*  CREATININE 2.50* 2.40* 2.19* 2.05*  CALCIUM 9.3 9.1 9.0 9.3   Liver Function Tests: Recent Labs  Lab 04/18/21 0534  AST 16  ALT 17  ALKPHOS 51  BILITOT 0.5  PROT 5.1*  ALBUMIN 2.6*   No results for input(s): LIPASE, AMYLASE in the last 168 hours. No results for input(s): AMMONIA in the last 168 hours. CBC: Recent Labs  Lab 04/17/21 2155 04/18/21 0534 04/19/21 0134 04/20/21 0743  WBC 8.5 8.5 8.2 7.2  NEUTROABS 4.8 5.3  --   --   HGB 11.1* 11.0* 11.1* 11.7*  HCT 34.8* 34.0* 34.2* 36.1*  MCV 95.6 96.3 96.3 96.0  PLT 274 251 236 275   Cardiac Enzymes: No results for input(s): CKTOTAL, CKMB, CKMBINDEX, TROPONINI in the last 168 hours. BNP: Invalid input(s): POCBNP CBG: No results for input(s): GLUCAP in the last 168 hours. D-Dimer No results for input(s): DDIMER in the last 72 hours. Hgb A1c No results for input(s): HGBA1C in the last 72 hours. Lipid Profile No results for input(s): CHOL, HDL, LDLCALC, TRIG, CHOLHDL, LDLDIRECT in the last 72 hours. Thyroid function studies No results for input(s): TSH, T4TOTAL, T3FREE, THYROIDAB in the last 72 hours.  Invalid input(s): FREET3 Anemia work up [employer]    04/18/21 0534  VITAMINB12 178*   Urinalysis No results found for: COLORURINE, APPEARANCEUR,  LABSPEC, [place], GLUCOSEU, [place], [place], [place], PROTEINUR, UROBILINOGEN, NITRITE, LEUKOCYTESUR Sepsis Labs Invalid input(s): PROCALCITONIN,  WBC,  LACTICIDVEN Microbiology Recent Results (from the past 240 hour(s))  Resp Panel by RT-PCR (Flu A&B, Covid) Nasopharyngeal Swab     Status: None   Collection Time: 04/18/21 12:40 AM   Specimen: Nasopharyngeal Swab; Nasopharyngeal(NP) swabs in vial transport medium  Result Value Ref Range Status   SARS Coronavirus 2 by RT PCR NEGATIVE NEGATIVE Final    Comment: (NOTE) SARS-CoV-2 target nucleic acids are NOT DETECTED.  The SARS-CoV-2 RNA is generally detectable in upper respiratory specimens during the acute phase of infection. The lowest concentration of SARS-CoV-2 viral copies this assay can detect is 138 copies/mL. A negative result does not preclude SARS-Cov-2 infection and should not be used as the sole basis for treatment or other patient management decisions. A negative result may occur with  improper specimen collection/handling, submission of specimen other than nasopharyngeal swab, presence of viral mutation(s) within the areas targeted by this assay, and inadequate number of viral copies(<138 copies/mL). A negative result must be combined with clinical observations, patient history, and epidemiological information. The expected result is Negative.  Fact Sheet for Patients:  EntrepreneurPulse.com.au  Fact Sheet for Healthcare Providers:  IncredibleEmployment.be  This test is no t yet approved or cleared by the [place] FDA and  has been authorized for detection and/or diagnosis of SARS-CoV-2 by FDA under an Emergency Use Authorization (EUA). This EUA will remain  in effect (meaning this test can be used) for the duration of the COVID-19 declaration under Section 564(b)(1) of the Act, 21 U.S.C.section 360bbb-3(b)(1), unless the authorization is terminated  or revoked sooner.        Influenza A by PCR NEGATIVE NEGATIVE Final   Influenza B by PCR NEGATIVE NEGATIVE Final    Comment: (NOTE) The Xpert Xpress SARS-CoV-2/FLU/RSV plus assay is intended as an aid in the diagnosis of influenza from Nasopharyngeal swab specimens and should not be used as a sole basis for treatment. Nasal washings and aspirates are unacceptable for Xpert Xpress SARS-CoV-2/FLU/RSV testing.  Fact Sheet for Patients: EntrepreneurPulse.com.au  Fact Sheet for Healthcare Providers: IncredibleEmployment.be  This test is not yet approved or cleared by the [place] FDA and has been authorized for detection and/or diagnosis of SARS-CoV-2 by FDA under an Emergency Use Authorization (EUA). This EUA will remain in effect (meaning this test can be used) for the duration of the COVID-19 declaration under Section 564(b)(1) of the Act, 21 U.S.C. section 360bbb-3(b)(1), unless the authorization is terminated or  revoked.  Performed at [facility], [address], [place], [place] [address]      Time coordinating discharge: 40 minutes  SIGNED:   Elmarie Shiley, MD  [facility]

## 2021-04-20 NOTE — TOC Transition Note (Signed)
Transition of Care [facility]) - CM/SW Discharge Note   Patient Details  Name: Shawn Cooley MRN: [id number] Date of Birth: Sep 03, 1950  [facility] CM/SW Contact:  Bartholomew Crews, RN Phone Number: [phone] 04/20/2021, 2:42 PM   Clinical Narrative:     Spoke with patient and spouse at the bedside. Provided Eliquis free trial and $10 copay card. No further TOC needs identified.   Final next level of care: Home/Self Care Barriers to Discharge: No Barriers Identified   Patient Goals and CMS Choice Patient states their goals for this hospitalization and ongoing recovery are:: home with wife CMS Medicare.gov Compare Post Acute Care list provided to:: Patient Choice offered to / list presented to : NA  Discharge Placement                       Discharge Plan and Services                DME Arranged: N/A DME Agency: NA       HH Arranged: NA HH Agency: NA        Social Determinants of Health (SDOH) Interventions     Readmission Risk Interventions No flowsheet data found.

## 2021-04-22 ENCOUNTER — Other Ambulatory Visit: Payer: Self-pay

## 2021-04-22 DIAGNOSIS — I82411 Acute embolism and thrombosis of right femoral vein: Secondary | ICD-10-CM

## 2021-04-22 LAB — PSA (REFLEX TO FREE) (SERIAL): Prostate Specific Ag, Serum: 5.1 ng/mL — ABNORMAL HIGH (ref 0.0–4.0)

## 2021-04-22 LAB — FPSA% REFLEX
% FREE PSA: 10 %
PSA, FREE: 0.51 ng/mL

## 2021-04-24 ENCOUNTER — Ambulatory Visit (HOSPITAL_COMMUNITY)
Admission: RE | Admit: 2021-04-24 | Discharge: 2021-04-24 | Disposition: A | Discharge status: Home / Self Care | Payer: Federal, State, Local not specified - PPO | Source: Ambulatory Visit | Attending: Vascular Surgery | Admitting: Vascular Surgery

## 2021-04-24 ENCOUNTER — Other Ambulatory Visit: Payer: Self-pay

## 2021-04-24 DIAGNOSIS — I82411 Acute embolism and thrombosis of right femoral vein: Secondary | ICD-10-CM

## 2021-05-06 ENCOUNTER — Encounter: Payer: Self-pay | Admitting: Vascular Surgery

## 2021-05-06 ENCOUNTER — Ambulatory Visit (INDEPENDENT_AMBULATORY_CARE_PROVIDER_SITE_OTHER): Payer: Federal, State, Local not specified - PPO | Admitting: Vascular Surgery

## 2021-05-06 ENCOUNTER — Other Ambulatory Visit: Payer: Self-pay

## 2021-05-06 VITALS — BP 111/72 | HR 69 | Temp 97.7°F | Resp 18 | Ht 70.0 in | Wt 186.0 lb

## 2021-05-06 DIAGNOSIS — I82411 Acute embolism and thrombosis of right femoral vein: Secondary | ICD-10-CM | POA: Diagnosis not present

## 2021-05-06 DIAGNOSIS — M7989 Other specified soft tissue disorders: Secondary | ICD-10-CM

## 2021-05-06 NOTE — Progress Notes (Signed)
Patient name: Shawn Cooley MRN: [id number] DOB: Oct 22, 1950 Sex: male  REASON FOR CONSULT: Evaluate right leg DVT  HPI: Shawn Cooley is a 70 y.o. male, with history of prostate cancer and chronic kidney disease from IgA nephropathy that presents for evaluation of right leg DVT.  States he initially had right leg swelling about 5 to 6 weeks ago and initially was told this was related to a Baker's cyst.  Ultimately was later determined this was likely a DVT.  He states around the time of injury he strained his right leg moving a heavy table.  He had a right leg DVT study on 04/17/2021 that showed DVT in the right common femoral, superficial femoral, popliteal and external iliac vein.  He was admitted to the hospital and put on blood thinners.  He has been tolerating Eliquis.  This is his first blood clot.  Sounds like admitting provider did talk to one my partners who did not recommend intervention at the time of his admission.  He denies any pain in the leg.  He has some swelling below the knee but none in the upper thigh.  Most bothersome aspect is swelling at the ankle at the end of the day.  Past Medical History:  Diagnosis Date   Asthma    Hyperlipidemia    Hypertension    IgA nephropathy 04/18/2021   Kidney disease    Stage 3b; IgA nephropathy   Prostate cancer ([place])    watchful waiting   Spinal stenosis    C5-6 HNP    Past Surgical History:  Procedure Laterality Date   CYST REMOVAL TRUNK  1970   pilonidal   RENAL BIOPSY     TONSILLECTOMY      Family History  Problem Relation Age of Onset   Stroke Paternal Aunt    Stroke Paternal Uncle    Cancer Paternal Aunt        ?   Hypertension Mother    Colon cancer Neg Hx    Prostate cancer Neg Hx    Diabetes Neg Hx     SOCIAL HISTORY: Social History   Socioeconomic History   Marital status: Married    Spouse name: Not on file   Number of children: 2   Years of education: Not on file   Highest education level:  Not on file  Occupational History   Occupation: Sales   Occupation: [employer]  Tobacco Use   Smoking status: Former    Types: Cigarettes    Quit date: 08/24/2006    Years since quitting: 14.7   Smokeless tobacco: Never   Tobacco comments:    still smokes , rarely, used to smoke more   Substance and Sexual Activity   Alcohol use: Yes    Alcohol/week: 14.0 standard drinks    Types: 14 Cans of beer per week    Comment: not daily, beer , occ 3-4 /day   Drug use: Yes    Types: Marijuana    Comment: occasional   Sexual activity: Not on file  Other Topics Concern   Not on file  Social History Narrative   Lives w/ wife   2 independent children    Social Determinants of Health   Financial Resource Strain: Not on file  Food Insecurity: Not on file  Transportation Needs: Not on file  Physical Activity: Not on file  Stress: Not on file  Social Connections: Not on file  Intimate Partner Violence: Not on file  Allergies  Allergen Reactions   Amoxicillin Other (See Comments)    Kidney related issue Kidney related issue    Current Outpatient Medications  Medication Sig Dispense Refill   albuterol (PROVENTIL HFA;VENTOLIN HFA) 108 (90 Base) MCG/ACT inhaler Inhale 2 puffs into the lungs every 6 (six) hours as needed. (Patient taking differently: Inhale 2 puffs into the lungs every 6 (six) hours as needed for shortness of breath or wheezing.) 18 g 5   amLODipine-benazepril (LOTREL) 10-40 MG capsule Take 1 capsule by mouth daily.     apixaban (ELIQUIS) 5 MG TABS tablet Take 1 tablet (5 mg total) by mouth 2 (two) times daily. 60 tablet 3   mometasone (ASMANEX) 220 MCG/INH inhaler Inhale 2 puffs into the lungs daily as needed. 1 Inhaler 5   simvastatin (ZOCOR) 10 MG tablet Take 10 mg by mouth daily.     allopurinol (ZYLOPRIM) 100 MG tablet Take 1 tablet (100 mg total) by mouth daily. (Patient not taking: Reported on 05/06/2021) 30 tablet 0   apixaban (ELIQUIS) 5 MG TABS tablet Take 2 tablets  (10 mg total) by mouth 2 (two) times daily for 7 days. 28 tablet 0   colchicine 0.6 MG tablet Take 0.6 mg by mouth daily as needed (gout pain). (Patient not taking: Reported on 05/06/2021)     dapagliflozin propanediol (FARXIGA) 10 MG TABS tablet Take 10 mg by mouth daily. Dr. Hanley Seamen samples (Patient not taking: Reported on 05/06/2021)     docusate sodium (COLACE) 100 MG capsule Take 1 capsule (100 mg total) by mouth 2 (two) times daily. (Patient not taking: Reported on 05/06/2021) 10 capsule 0   No current facility-administered medications for this visit.    REVIEW OF SYSTEMS:  '[X]'$  denotes positive finding, '[ ]'$  denotes negative finding Cardiac  Comments:  Chest pain or chest pressure:    Shortness of breath upon exertion:    Short of breath when lying flat:    Irregular heart rhythm:        Vascular    Pain in calf, thigh, or hip brought on by ambulation:    Pain in feet at night that wakes you up from your sleep:     Blood clot in your veins:    Leg swelling:  x Right leg      Pulmonary    Oxygen at home:    Productive cough:     Wheezing:         Neurologic    Sudden weakness in arms or legs:     Sudden numbness in arms or legs:     Sudden onset of difficulty speaking or slurred speech:    Temporary loss of vision in one eye:     Problems with dizziness:         Gastrointestinal    Blood in stool:     Vomited blood:         Genitourinary    Burning when urinating:     Blood in urine:        Psychiatric    Major depression:         Hematologic    Bleeding problems:    Problems with blood clotting too easily:        Skin    Rashes or ulcers:        Constitutional    Fever or chills:      PHYSICAL EXAM: Vitals:   05/06/21 1233  BP: 111/72  Pulse: 69  Resp: 18  Temp:  97.7 F (36.5 C)  TempSrc: Temporal  SpO2: 95%  Weight: 186 lb (84.4 kg)  Height: '5\' 10"'$  (1.778 m)    GENERAL: The patient is a well-nourished male, in no acute distress. The vital signs  are documented above. CARDIAC: There is a regular rate and rhythm.  VASCULAR:  Palpable femoral pulses bilaterally Palpable DP PT pulses bilaterally Notable right calf swelling that does not extend into the thigh PULMONARY: No respiratory distress ABDOMEN: Soft and non-tender MUSCULOSKELETAL: There are no major deformities or cyanosis. NEUROLOGIC: No focal weakness or paresthesias are detected. SKIN: There are no ulcers or rashes noted. PSYCHIATRIC: The patient has a normal affect.  DATA:   Right leg venous duplex from 04/24/2021 shows thrombus extending into the external iliac vein but no thrombus in the common iliac vein or IVC  Assessment/Plan:  70 year old male presents for evaluation of extensive right leg DVT.  He first had symptom onset about 5 or 6 weeks ago and has seen fairly significant improvement on Eliquis.  I reviewed his previous duplex studies that does show thrombus extending into the external iliac vein on the right.  Him and his wife had a number of questions about intervention and discussed the role of intervention is to prevent post thrombotic syndrome long-term.  Discussed the concept of percutaneous mechanical thrombectomy and discussed we could use IVUS so that he does not have contrast exposure.  He is not having any pain in the leg other than just some swelling below the knee and we both agreed that conservative management would likely be the best approach for him.  I recommended 6 months of anticoagulation given I think this is a provoked first-time DVT.  This appears to be associated with a localized trauma when he strained his leg moving a table and then was fairly immobilized afterward.  We did get him sized for a knee-high stocking.  Discussed the importance of wearing this and elevating his leg.  He can follow-up with me as needed in the future.   Marty Heck, MD [facility] Office: [phone]

## 2021-06-19 ENCOUNTER — Other Ambulatory Visit: Payer: Self-pay

## 2021-06-19 ENCOUNTER — Telehealth: Payer: Self-pay

## 2021-06-19 DIAGNOSIS — I82411 Acute embolism and thrombosis of right femoral vein: Secondary | ICD-10-CM

## 2021-06-19 NOTE — Telephone Encounter (Signed)
Patient's wife called to report that s/p trip to [place], patient's RLE has increased swelling and pain. He has known extensive RLE DVT. Talked to patient, the pain is "nagging" swelling is mostly concentrated below the knee. He is still elevating his leg, wearing compression and taking Eliquis. He wants to avoid intervention if possible. Discussed with Dr. Carlis Abbott - patient is on front line therapy for his known DVT including Eliquis - will defer to patient if he wants appt. Patient would like to be seen again - scheduled for IVC iliac duplex and DVT and office visit with [place].

## 2021-07-04 ENCOUNTER — Ambulatory Visit (INDEPENDENT_AMBULATORY_CARE_PROVIDER_SITE_OTHER)
Admission: RE | Admit: 2021-07-04 | Discharge: 2021-07-04 | Disposition: A | Discharge status: Home / Self Care | Payer: Federal, State, Local not specified - PPO | Source: Ambulatory Visit | Attending: Vascular Surgery | Admitting: Vascular Surgery

## 2021-07-04 ENCOUNTER — Ambulatory Visit (HOSPITAL_COMMUNITY)
Admission: RE | Admit: 2021-07-04 | Discharge: 2021-07-04 | Disposition: A | Discharge status: Home / Self Care | Payer: Federal, State, Local not specified - PPO | Source: Ambulatory Visit | Attending: Vascular Surgery | Admitting: Vascular Surgery

## 2021-07-04 ENCOUNTER — Other Ambulatory Visit: Payer: Self-pay

## 2021-07-04 DIAGNOSIS — I82411 Acute embolism and thrombosis of right femoral vein: Secondary | ICD-10-CM | POA: Diagnosis not present

## 2021-07-08 ENCOUNTER — Other Ambulatory Visit: Payer: Self-pay

## 2021-07-08 ENCOUNTER — Encounter: Payer: Self-pay | Admitting: Vascular Surgery

## 2021-07-08 ENCOUNTER — Ambulatory Visit: Payer: Federal, State, Local not specified - PPO | Admitting: Vascular Surgery

## 2021-07-08 VITALS — BP 117/71 | HR 71 | Temp 98.5°F | Resp 20 | Ht 70.0 in | Wt 190.0 lb

## 2021-07-08 DIAGNOSIS — I82411 Acute embolism and thrombosis of right femoral vein: Secondary | ICD-10-CM | POA: Diagnosis not present

## 2021-07-08 NOTE — Progress Notes (Signed)
Patient name: Shawn Cooley MRN: [id number] DOB: 22-Dec-1950 Sex: male  REASON FOR CONSULT: F/U right leg DVT with worsening leg swelling  HPI: Shawn Cooley is a 70 y.o. 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  He was initially seen in September 2022 with extensive right leg DVT. He states around the time of DVT he strained his right leg moving a heavy table.  He had a right leg DVT study on 04/17/2021 that showed DVT in the right common femoral, superficial femoral, popliteal and external iliac vein.  He was admitted to the hospital and put on blood thinners.  He was taking Eliquis.  First DVT.  He elected not to undergo intervention after discussing percutaneous mechanical thrombectomy during his previous clinic visit.  He recently traveled to [place] last month.  During vacation noted worsening right leg swelling and presents for follow-up.  States he has been wearing his compression stockings.  Has been taking his Eliquis and has not missed any doses.  Feels this his leg swelling has gotten better after he was on his feet for long peers of time traveling.   Past Medical History:  Diagnosis Date   Asthma    Hyperlipidemia    Hypertension    IgA nephropathy 04/18/2021   Kidney disease    Stage 3b; IgA nephropathy   Prostate cancer ([place])    watchful waiting   Spinal stenosis    C5-6 HNP    Past Surgical History:  Procedure Laterality Date   CYST REMOVAL TRUNK  1970   pilonidal   RENAL BIOPSY     TONSILLECTOMY      Family History  Problem Relation Age of Onset   Stroke Paternal Aunt    Stroke Paternal Uncle    Cancer Paternal Aunt        ?   Hypertension Mother    Colon cancer Neg Hx    Prostate cancer Neg Hx    Diabetes Neg Hx     SOCIAL HISTORY: Social History   Socioeconomic History   Marital status: Married    Spouse name: Not on file    Number of children: 2   Years of education: Not on file   Highest education level: Not on file  Occupational History   Occupation: Sales   Occupation: [employer]  Tobacco Use   Smoking status: Former    Types: Cigarettes    Quit date: 08/24/2006    Years since quitting: 14.8   Smokeless tobacco: Never   Tobacco comments:    still smokes , rarely, used to smoke more   Vaping Use   Vaping Use: Never used  Substance and Sexual Activity   Alcohol use: Yes    Alcohol/week: 14.0 standard drinks    Types: 14 Cans of beer per week    Comment: not daily, beer , occ 3-4 /day   Drug use: Yes    Types: Marijuana    Comment: occasional   Sexual activity: Not on file  Other Topics Concern   Not on file  Social History Narrative   Lives w/ wife   2 independent children    Social Determinants of Health   Financial Resource Strain: Not on file  Food Insecurity: Not on file  Transportation Needs: Not on file  Physical Activity: Not on file  Stress: Not on file  Social Connections: Not on file  Intimate Partner Violence: Not on file    Allergies  Allergen Reactions   Amoxicillin Other (See Comments)    Kidney related issue Kidney related issue    Current Outpatient Medications  Medication Sig Dispense Refill   albuterol (PROVENTIL HFA;VENTOLIN HFA) 108 (90 Base) MCG/ACT inhaler Inhale 2 puffs into the lungs every 6 (six) hours as needed. (Patient taking differently: Inhale 2 puffs into the lungs every 6 (six) hours as needed for shortness of breath or wheezing.) 18 g 5   allopurinol (ZYLOPRIM) 100 MG tablet Take 1 tablet (100 mg total) by mouth daily. 30 tablet 0   amLODipine-benazepril (LOTREL) 10-40 MG capsule Take 1 capsule by mouth daily.     apixaban (ELIQUIS) 5 MG TABS tablet Take 1 tablet (5 mg total) by mouth 2 (two) times daily. 60 tablet 3   colchicine 0.6 MG tablet Take 0.6 mg by mouth daily as needed (gout pain).     dapagliflozin propanediol (FARXIGA) 10 MG TABS tablet  Take 10 mg by mouth daily. Dr. Hanley Seamen samples     docusate sodium (COLACE) 100 MG capsule Take 1 capsule (100 mg total) by mouth 2 (two) times daily. 10 capsule 0   mometasone (ASMANEX) 220 MCG/INH inhaler Inhale 2 puffs into the lungs daily as needed. 1 Inhaler 5   simvastatin (ZOCOR) 10 MG tablet Take 10 mg by mouth daily.     No current facility-administered medications for this visit.    REVIEW OF SYSTEMS:  [X]  denotes positive finding, [ ]  denotes negative finding Cardiac  Comments:  Chest pain or chest pressure:    Shortness of breath upon exertion:    Short of breath when lying flat:    Irregular heart rhythm:        Vascular    Pain in calf, thigh, or hip brought on by ambulation:    Pain in feet at night that wakes you up from your sleep:     Blood clot in your veins:    Leg swelling:  x Right leg      Pulmonary    Oxygen at home:    Productive cough:     Wheezing:         Neurologic    Sudden weakness in arms or legs:     Sudden numbness in arms or legs:     Sudden onset of difficulty speaking or slurred speech:    Temporary loss of vision in one eye:     Problems with dizziness:         Gastrointestinal    Blood in stool:     Vomited blood:         Genitourinary    Burning when urinating:     Blood in urine:        Psychiatric    Major depression:         Hematologic    Bleeding problems:    Problems with blood clotting too easily:        Skin    Rashes or ulcers:        Constitutional    Fever or chills:      PHYSICAL EXAM: Vitals:   07/08/21 0817  BP: 117/71  Pulse: 71  Resp: 20  Temp: 98.5 F (36.9 C)  SpO2: 98%  Weight: 190 lb (86.2 kg)  Height: 5\' 10"  (1.778 m)    GENERAL: The patient is a well-nourished male, in no acute distress. The vital signs are documented above. CARDIAC: There  is a regular rate and rhythm.  VASCULAR:  Palpable femoral pulses bilaterally Palpable DP pulses bilaterally Mild right calf swelling PULMONARY: No  respiratory distress ABDOMEN: Soft and non-tender MUSCULOSKELETAL: There are no major deformities or cyanosis. NEUROLOGIC: No focal weakness or paresthesias are detected. SKIN: There are no ulcers or rashes noted. PSYCHIATRIC: The patient has a normal affect.  DATA:   IVC and iliac duplex from 07/04/2021 shows no thrombus in the iliac vein which is improved from September when he had thrombus in the right external iliac vein.  There is some chronic thrombus in the superficial femoral vein and popliteal vein in the right leg.    Assessment/Plan:  70 year old male presents for follow-up and reevaluation of extensive right leg DVT in the setting of worsening leg swelling.  Discussed that his repeat duplex on 07/04/2021 shows significant improvement in thrombus.  There is no evidence of clot in the iliac vein which is improved from his duplex on 04/24/2021.  He has some chronic appearing thrombus in the superficial femoral and popliteal vein.  I provided reassurance.  I think he needs to complete his 6 months of Eliquis as previously recommended given a first-time provoked DVT following an injury.  He previously elected not to undergo percutaneous mechanical thrombectomy and I do not think there is any role at this time given improvement on duplex.  He has some mild swelling on exam.  I think he will probably always have some swelling in the right leg given some post-thrombotic syndrome.  Discussed the importance of elevation and compression.  He can follow-up as needed in the future.   Marty Heck, MD [facility] Office: [phone]

## 2021-08-28 ENCOUNTER — Other Ambulatory Visit: Payer: Self-pay

## 2021-08-28 MED ORDER — APIXABAN 5 MG PO TABS: 5.0000 mg | ORAL_TABLET | Freq: Two times a day (BID) | ORAL | 3 refills | Status: DC

## 2021-11-21 ENCOUNTER — Other Ambulatory Visit: Payer: Self-pay | Admitting: Urology

## 2021-11-21 DIAGNOSIS — N281 Cyst of kidney, acquired: Secondary | ICD-10-CM

## 2021-11-21 DIAGNOSIS — C61 Malignant neoplasm of prostate: Secondary | ICD-10-CM

## 2021-12-08 ENCOUNTER — Ambulatory Visit
Admission: RE | Admit: 2021-12-08 | Discharge: 2021-12-08 | Disposition: A | Discharge status: Home / Self Care | Payer: Federal, State, Local not specified - PPO | Source: Ambulatory Visit | Attending: Urology | Admitting: Urology

## 2021-12-08 DIAGNOSIS — N281 Cyst of kidney, acquired: Secondary | ICD-10-CM

## 2021-12-08 MED ORDER — GADOBENATE DIMEGLUMINE 529 MG/ML IV SOLN
17.0000 mL | Freq: Once | INTRAVENOUS | Status: AC | PRN
  Administered 2021-12-08: 17 mL via INTRAVENOUS

## 2021-12-12 ENCOUNTER — Ambulatory Visit
Admission: RE | Admit: 2021-12-12 | Discharge: 2021-12-12 | Disposition: A | Discharge status: Home / Self Care | Payer: Federal, State, Local not specified - PPO | Source: Ambulatory Visit | Attending: Urology | Admitting: Urology

## 2021-12-12 DIAGNOSIS — C61 Malignant neoplasm of prostate: Secondary | ICD-10-CM

## 2021-12-12 MED ORDER — GADOBENATE DIMEGLUMINE 529 MG/ML IV SOLN: 18.0000 mL | Freq: Once | INTRAVENOUS | Status: DC | PRN

## 2021-12-12 MED ORDER — GADOBENATE DIMEGLUMINE 529 MG/ML IV SOLN
18.0000 mL | Freq: Once | INTRAVENOUS | Status: AC | PRN
  Administered 2021-12-12: 18 mL via INTRAVENOUS

## 2022-04-25 IMAGING — MR MR ABDOMEN WO/W CM
18 series · 48 of 48 positions shown · IV contrast (8ML GADAVIST)
Comparison: Prior ultrasound from 5000. No recent priors for
comparison.

CLINICAL DATA: Microscopic hematuria by report

EXAM:
MRI ABDOMEN WITHOUT AND WITH CONTRAST
TECHNIQUE: Multiplanar multisequence MR imaging of the abdomen was performed
both before and after the administration of intravenous contrast.
CONTRAST:  8mL GADAVIST GADOBUTROL 1 MMOL/ML IV SOLN

[Series 3: T2 · coronal · 6.0mm · 1.56mm/px · 2 of 37 slices shown (1 of 2)]
[im 1/37]
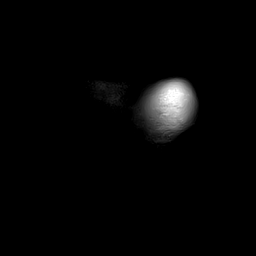
[im 37/37]
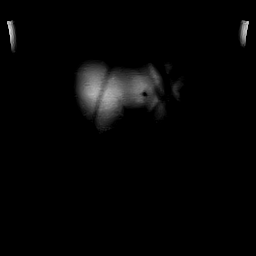

[Series 5: T2 fat-sat · axial · 6.0mm · 1.25mm/px · z∈[-196,+56]mm · 2 of 36 slices shown]
[im 1/36]
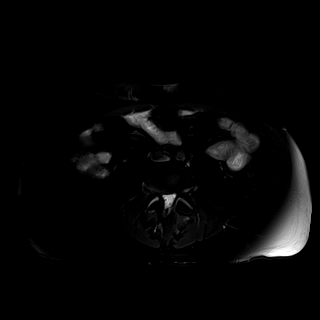
[im 36/36]
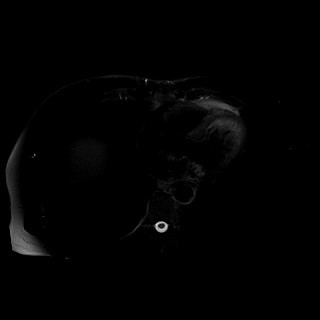

[Series 6: DWI · axial · 6.0mm · 1.49mm/px · z∈[-202,+64]mm · 4 of 76 slices shown (1 of 2)]
[im 1/76]
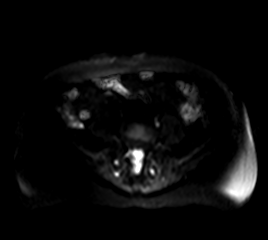
[im 26/76]
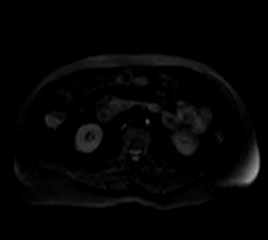
[im 51/76]
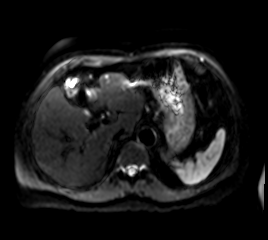
[im 76/76]
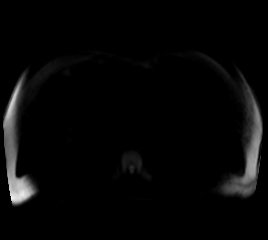

[Series 7: DWI · axial · 6.0mm · 1.49mm/px · z∈[-202,+64]mm · 2 of 38 slices shown (2 of 2)]
[im 1/38]
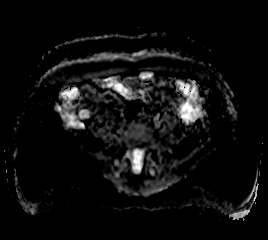
[im 38/38]
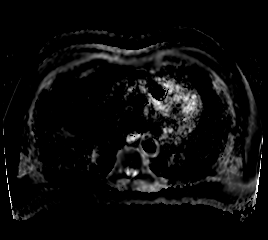

[Series 8: T1 · axial · 3.0mm · 1.25mm/px · z∈[-200,+61]mm · 3 of 88 slices shown (1 of 2)]
[im 1/88]
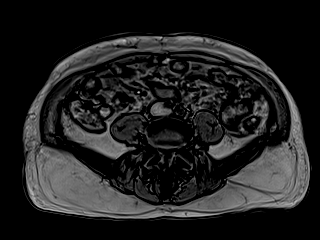
[im 44/88]
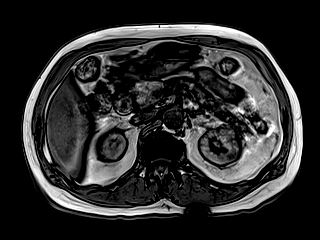
[im 88/88]
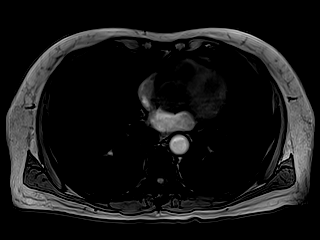

[Series 9: T1 · axial · 3.0mm · 1.25mm/px · z∈[-200,+61]mm · 3 of 88 slices shown (2 of 2)]
[im 1/88]
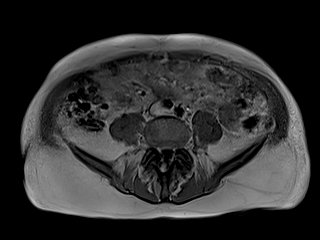
[im 44/88]
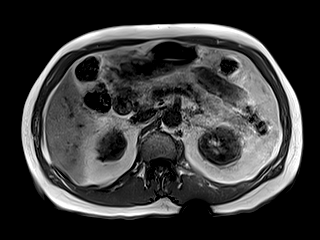
[im 88/88]
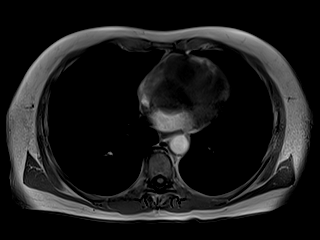

[Series 10: bSSFP · axial · 5.0mm · 0.84mm/px · z∈[-203,+72]mm · 2 of 51 slices shown]
[im 1/51]
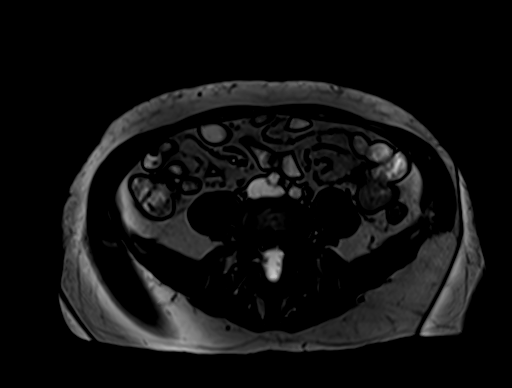
[im 51/51]
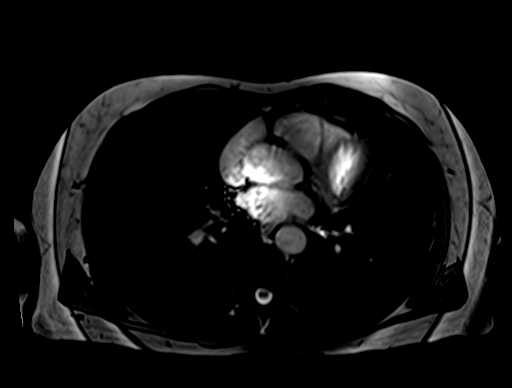

[Series 12: T1 dynamic · axial · 3.0mm · 1.25mm/px · z∈[-210,+75]mm · 3 of 96 slices shown (1 of 10)]
[im 1/96]
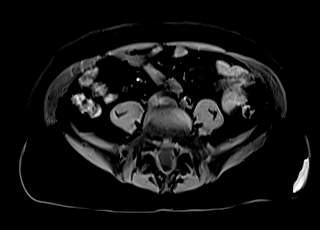
[im 48/96]
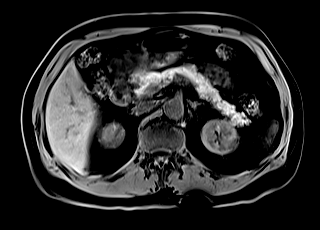
[im 96/96]
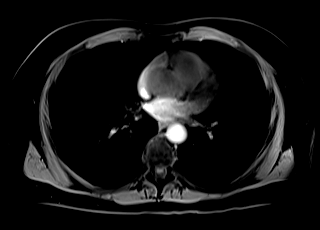

[Series 16: T1 dynamic · axial · 3.0mm · 1.25mm/px · z∈[-210,+75]mm · 3 of 96 slices shown (2 of 10)]
[im 1/96]
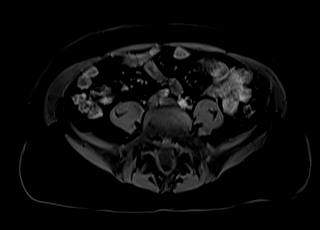
[im 48/96]
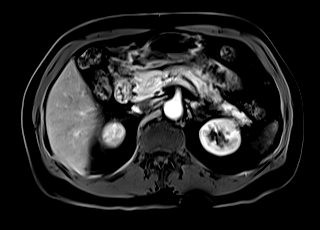
[im 96/96]
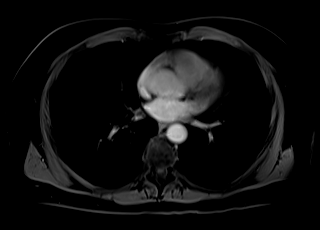

[Series 17: T1 dynamic · axial · 3.0mm · 1.25mm/px · z∈[-210,+75]mm · 3 of 96 slices shown (3 of 10)]
[im 1/96]
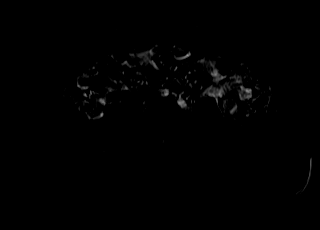
[im 48/96]
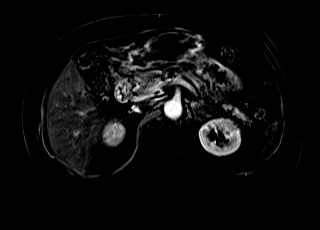
[im 96/96]
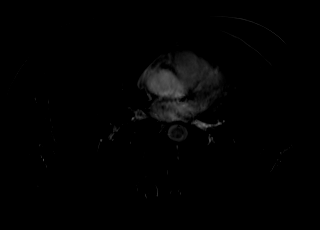

[Series 20: T1 dynamic · axial · 3.0mm · 1.25mm/px · z∈[-210,+75]mm · 3 of 96 slices shown (4 of 10)]
[im 1/96]
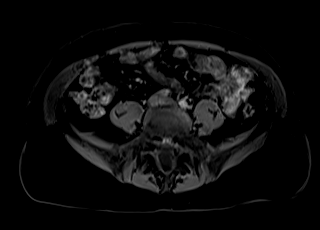
[im 48/96]
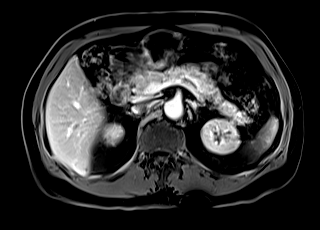
[im 96/96]
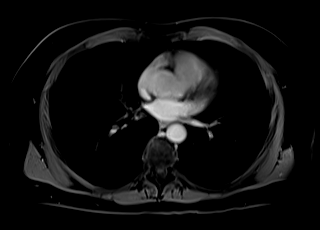

[Series 21: T1 dynamic · axial · 3.0mm · 1.25mm/px · z∈[-210,+75]mm · 3 of 96 slices shown (5 of 10)]
[im 1/96]
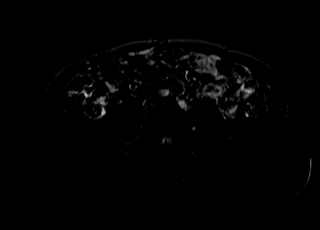
[im 48/96]
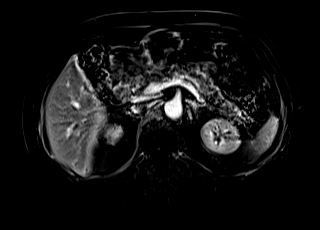
[im 96/96]
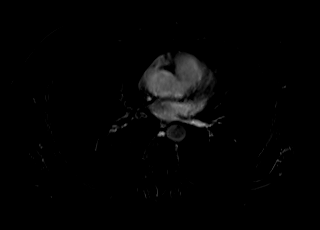

[Series 24: T1 dynamic · axial · 3.0mm · 1.25mm/px · z∈[-210,+75]mm · 3 of 96 slices shown (6 of 10)]
[im 1/96]
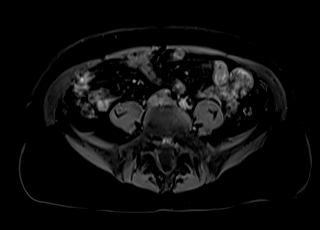
[im 48/96]
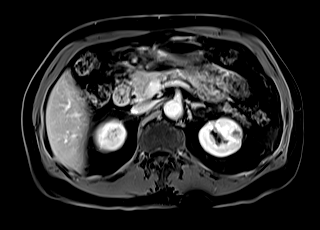
[im 96/96]
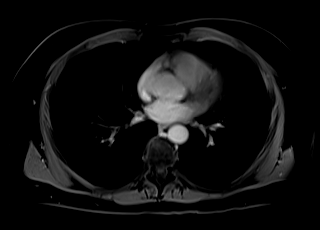

[Series 25: T1 dynamic · axial · 3.0mm · 1.25mm/px · z∈[-210,+75]mm · 3 of 96 slices shown (7 of 10)]
[im 1/96]
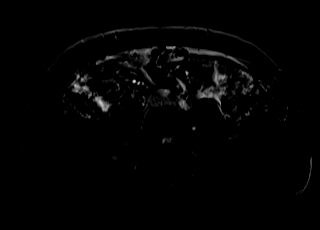
[im 48/96]
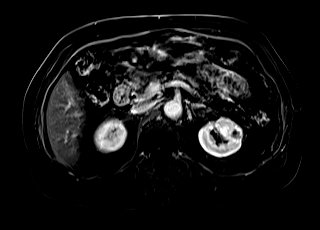
[im 96/96]
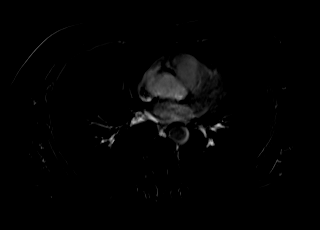

[Series 27: T1 dynamic · coronal · 4.5mm · 1.41mm/px · 2 of 56 slices shown (8 of 10)]
[im 1/56]
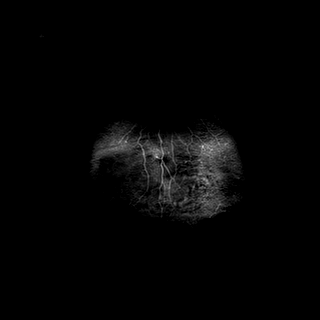
[im 56/56]
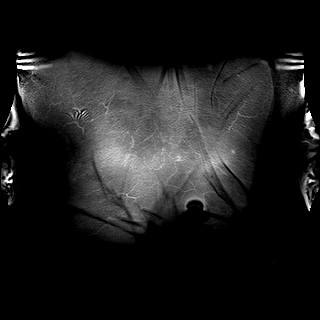

[Series 28: T2 · axial · 6.0mm · 1.56mm/px · 1 of 41 slices shown (2 of 2)]
[im 1/41]
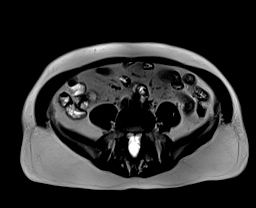

[Series 31: T1 dynamic · axial · 3.0mm · 1.25mm/px · z∈[-210,+75]mm · 3 of 96 slices shown (9 of 10)]
[im 1/96]
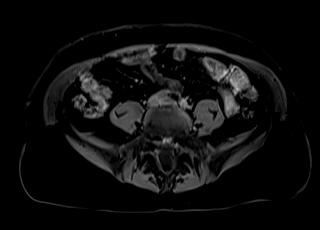
[im 48/96]
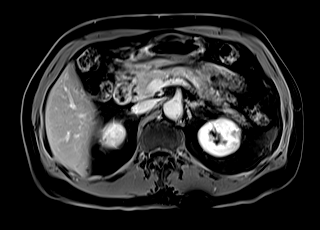
[im 96/96]
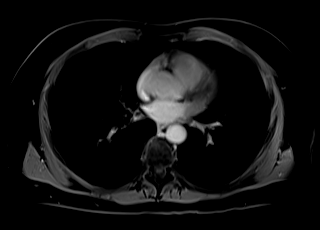

[Series 32: T1 dynamic · axial · 3.0mm · 1.25mm/px · z∈[-210,+75]mm · 3 of 96 slices shown (10 of 10)]
[im 1/96]
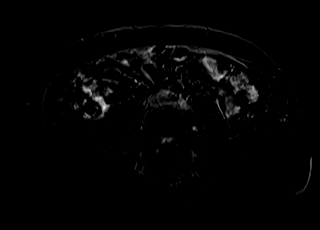
[im 48/96]
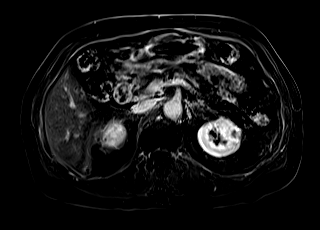
[im 96/96]
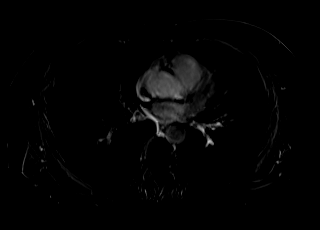

[48 of 48 positions shown; findings below may reference images not displayed]

FINDINGS: Lower chest: No effusion. No consolidation. Limited assessment of
the lung bases on MRI.

Hepatobiliary: Mild hepatic steatosis and multiple hepatic cysts.
Largest hepatic cyst in the posterior RIGHT hemiliver measuring
x 3.8 cm. Portal vein is patent. No pericholecystic stranding or
biliary duct dilation.

Pancreas:  Normal, without mass, inflammation or ductal dilatation.

Spleen:  Spleen normal size and contour without focal lesion.

Adrenals/Urinary Tract:  Adrenal glands are normal.

Symmetric renal enhancement. Cystic lesion with greater than 3
septations in the RIGHT kidney interpolar portion. The septations
are fine, thin septations that show visible enhancement measuring
less than 2 mm. No discrete area of septal nodularity is visualized
when viewed in coronal and axial plane accounting for volume
averaging. Area is T2 bright and hypointense on T1 with intrinsic T1
hyperintensity along the posterior margin potentially a small amount
of hemorrhage

This lesion measures 3.6 x 2.8 cm greatest axial dimension.

Small cysts elsewhere in the RIGHT kidney.

Cystic lesion in the LEFT kidney measuring 1 cm (image w 51, series
25) subtraction images show no definite signs of enhancement. Study
is limited by motion artifact. Thin septations are suggested
internally on T2 weighted imaging.

Stomach/Bowel: Visualized portions within the abdomen are
unremarkable.

Vascular/Lymphatic: Patent abdominal vasculature. No adenopathy in
the abdomen.

Other:  None

Musculoskeletal: No suspicious bone lesions identified.
IMPRESSION: 1. Cystic lesion in the RIGHT kidney with greater than 3 septations
that show visible enhancement measuring less than 2 mm. No discrete
area of septal nodularity is visualized. Best characterized at this
time as a Bosniak category II F lesion. Six-month follow-up is
suggested for further evaluation, to assess for stability. The
majority of lesions in this category are benign. However, follow-up
is necessary to assess for any changes that would indicate renal
neoplasm.
2. Cystic lesion in the LEFT kidney with thin septations but no
definite signs of enhancement. Compatible with Bosniak category II
renal cyst.
3. Study not performed as dedicated hematuria evaluation. If there
is continued concern for hematuria could consider a follow-up
examination with CT hematuria protocol as warranted for further
evaluation.
4. Mild hepatic steatosis and multiple hepatic cysts.

## 2022-12-02 ENCOUNTER — Encounter: Payer: Self-pay | Admitting: Internal Medicine

## 2022-12-07 ENCOUNTER — Telehealth: Payer: Self-pay

## 2022-12-07 NOTE — Telephone Encounter (Signed)
Called in attempt to confirm anticoagulation. Pt listed as taking Eliquis. Per protocol pt on blood thinners require OV within 1 year of procedure. If the patient calls back and confirms he is taking Eliquis please cancel out his PV and colonoscopy and have him r/s for OV with MD or APP.

## 2022-12-08 ENCOUNTER — Telehealth: Payer: Self-pay | Admitting: *Deleted

## 2022-12-08 NOTE — Telephone Encounter (Signed)
Attempted to reach pt to verify if he is on blood thinner. Unable to reach. LM with contact # for pt to call back.

## 2022-12-10 ENCOUNTER — Telehealth: Payer: Self-pay | Admitting: *Deleted

## 2022-12-10 NOTE — Telephone Encounter (Signed)
3rd attempt to reach pt to clarify if taking blood thinner. LM with #for pt to call back.

## 2022-12-11 ENCOUNTER — Telehealth: Payer: Self-pay | Admitting: *Deleted

## 2022-12-11 NOTE — Telephone Encounter (Signed)
Attempted to reach pt to clarify if on blood thinners. Unable to reach LM with # to call back.

## 2022-12-14 ENCOUNTER — Ambulatory Visit (AMBULATORY_SURGERY_CENTER): Payer: Federal, State, Local not specified - PPO | Admitting: *Deleted

## 2022-12-14 ENCOUNTER — Telehealth: Payer: Self-pay | Admitting: *Deleted

## 2022-12-14 VITALS — Ht 70.0 in | Wt 185.0 lb

## 2022-12-14 DIAGNOSIS — Z1211 Encounter for screening for malignant neoplasm of colon: Secondary | ICD-10-CM

## 2022-12-14 NOTE — Telephone Encounter (Signed)
Attempt to reach pt for pre-visit. LM with # for pt to call back. Instructed pt to call back by end of day to reschedule pre-visit or per protocol pre-visit and procedure will be canceled.

## 2022-12-14 NOTE — Progress Notes (Signed)
Pt's name and DOB verified at the beginning of the pre-visit.  Pt denies any difficulty with ambulating.  No egg or soy allergy known to patient  No issues known to pt with past sedation with any surgeries or procedures Patient denies ever being intubated Pt has no issues moving head neck or swallowing No FH of Malignant Hyperthermia Pt is not on diet pills Pt is not on home 02  Pt is not on blood thinners  Pt denies issues with constipation  Pt is not on dialysis Pt denies any upcoming cardiac testing Pt encouraged to use to use Singlecare or Goodrx to reduce cost  Patient's chart reviewed by Cathlyn Parsons CNRA prior to pre-visit and patient appropriate for the [facility].  Pre-visit completed and red dot placed by patient's name on their procedure day (on provider's schedule).  . Visit by phone Pt states weight is  Instructed pt why it is important to and  to call if they have any changes in health or new medications. Directed them to the # given and on instructions.   Pt states they will.  Instructions reviewed with pt and pt states understanding. Instructed to review again prior to procedure. Pt states they will.  Instructions sent by mail with coupon and by my chart  Instructed  not to smoke Marijuana day of procedure and day before procedure

## 2022-12-24 ENCOUNTER — Other Ambulatory Visit: Payer: Self-pay | Admitting: Urology

## 2022-12-24 DIAGNOSIS — N281 Cyst of kidney, acquired: Secondary | ICD-10-CM

## 2022-12-24 DIAGNOSIS — C61 Malignant neoplasm of prostate: Secondary | ICD-10-CM

## 2023-01-06 ENCOUNTER — Encounter: Payer: Self-pay | Admitting: Internal Medicine

## 2023-01-06 ENCOUNTER — Ambulatory Visit (AMBULATORY_SURGERY_CENTER): Payer: Federal, State, Local not specified - PPO | Admitting: Internal Medicine

## 2023-01-06 VITALS — BP 110/77 | HR 62 | Temp 97.3°F | Resp 12 | Ht 70.0 in | Wt 185.0 lb

## 2023-01-06 DIAGNOSIS — Z1211 Encounter for screening for malignant neoplasm of colon: Secondary | ICD-10-CM

## 2023-01-06 MED ORDER — SODIUM CHLORIDE 0.9 % IV SOLN: 500.0000 mL | Freq: Once | INTRAVENOUS | Status: DC

## 2023-01-06 NOTE — Progress Notes (Signed)
Vitals-DT  Pt's states no medical or surgical changes since previsit or office visit.  

## 2023-01-06 NOTE — Patient Instructions (Addendum)
I am pleased to report that I did not find any polyps or signs of cancer.  You do have diverticulosis and hemorrhoids were swollen (happens with the colonoscopy prep).  I do not recommend any further routine repeat colonoscopy or other colon cancer screening testing in your case.  I appreciate the opportunity to care for you. Iva Boop, MD, [facility]    Handouts on diverticulosis & hemorrhoids given to you today    YOU HAD AN ENDOSCOPIC PROCEDURE TODAY AT THE [facility]:   Refer to the procedure report that was given to you for any specific questions about what was found during the examination.  If the procedure report does not answer your questions, please call your gastroenterologist to clarify.  If you requested that your care partner not be given the details of your procedure findings, then the procedure report has been included in a sealed envelope for you to review at your convenience later.  YOU SHOULD EXPECT: Some feelings of bloating in the abdomen. Passage of more gas than usual.  Walking can help get rid of the air that was put into your GI tract during the procedure and reduce the bloating. If you had a lower endoscopy (such as a colonoscopy or flexible sigmoidoscopy) you may notice spotting of blood in your stool or on the toilet paper. If you underwent a bowel prep for your procedure, you may not have a normal bowel movement for a few days.  Please Note:  You might notice some irritation and congestion in your nose or some drainage.  This is from the oxygen used during your procedure.  There is no need for concern and it should clear up in a day or so.  SYMPTOMS TO REPORT IMMEDIATELY:  Following lower endoscopy (colonoscopy or flexible sigmoidoscopy):  Excessive amounts of blood in the stool  Significant tenderness or worsening of abdominal pains  Swelling of the abdomen that is new, acute  Fever of 100F or higher  For urgent or emergent issues, a  gastroenterologist can be reached at any hour by calling [phone]. Do not use MyChart messaging for urgent concerns.    DIET:  We do recommend a small meal at first, but then you may proceed to your regular diet.  Drink plenty of fluids but you should avoid alcoholic beverages for 24 hours.  ACTIVITY:  You should plan to take it easy for the rest of today and you should NOT DRIVE or use heavy machinery until tomorrow (because of the sedation medicines used during the test).    FOLLOW UP: Our staff will call the number listed on your records the next business day following your procedure.  We will call around 7:15- 8:00 am to check on you and address any questions or concerns that you may have regarding the information given to you following your procedure. If we do not reach you, we will leave a message.     If any biopsies were taken you will be contacted by phone or by letter within the next 1-3 weeks.  Please call us at [phone] if you have not heard about the biopsies in 3 weeks.    SIGNATURES/CONFIDENTIALITY: You and/or your care partner have signed paperwork which will be entered into your electronic medical record.  These signatures attest to the fact that that the information above on your After Visit Summary has been reviewed and is understood.  Full responsibility of the confidentiality of this discharge information lies  with you and/or your care-partner.

## 2023-01-06 NOTE — Progress Notes (Signed)
[facility] History and Physical   Primary Care Physician:  Macy Mis, MD   Reason for Procedure:   CRCA screening  Plan:    colonoscopy     HPI: Shawn Cooley is a 72 y.o. male for screening exam Negative colonoscopy 2014   Past Medical History:  Diagnosis Date   Asthma    Hyperlipidemia    Hypertension    IgA nephropathy 04/18/2021   Kidney disease    Stage 3b; IgA nephropathy   Prostate cancer (HCC)    watchful waiting   Spinal stenosis    C5-6 HNP    Past Surgical History:  Procedure Laterality Date   CYST REMOVAL TRUNK  1970   pilonidal   RENAL BIOPSY     TONSILLECTOMY      Prior to Admission medications   Medication Sig Start Date End Date Taking? Authorizing Provider  allopurinol (ZYLOPRIM) 100 MG tablet Take 1 tablet (100 mg total) by mouth daily. 02/15/17  Yes Paz, Nolon Rod, MD  amLODipine-benazepril (LOTREL) 10-40 MG capsule Take 1 capsule by mouth daily. 04/02/21  Yes [provider]  atorvastatin (LIPITOR) 20 MG tablet Take 20 mg by mouth daily.   Yes [provider]  docusate sodium (COLACE) 100 MG capsule Take 1 capsule (100 mg total) by mouth 2 (two) times daily. 04/20/21  Yes Regalado, Belkys A, MD  hydrochlorothiazide (HYDRODIURIL) 25 MG tablet Take by mouth. 01/03/21  Yes [provider]  mometasone (ASMANEX) 220 MCG/INH inhaler Inhale 2 puffs into the lungs daily as needed. 07/30/16  Yes Paz, Nolon Rod, MD  omega-3 acid ethyl esters (LOVAZA) 1 g capsule Take 3 capsules by mouth daily. 11/27/20  Yes [provider]  tamsulosin (FLOMAX) 0.4 MG CAPS capsule Take 0.4 mg by mouth at bedtime.   Yes [provider]  albuterol (PROVENTIL HFA;VENTOLIN HFA) 108 (90 Base) MCG/ACT inhaler Inhale 2 puffs into the lungs every 6 (six) hours as needed. Patient not taking: Reported on 12/14/2022 07/30/16   Wanda Plump, MD  colchicine 0.6 MG tablet Take 0.6 mg by mouth daily as needed (gout pain).    [provider]  fluticasone (FLONASE) 50 MCG/ACT nasal spray Place into both nostrils daily.    [provider]    Current Outpatient Medications  Medication Sig Dispense Refill   allopurinol (ZYLOPRIM) 100 MG tablet Take 1 tablet (100 mg total) by mouth daily. 30 tablet 0   amLODipine-benazepril (LOTREL) 10-40 MG capsule Take 1 capsule by mouth daily.     atorvastatin (LIPITOR) 20 MG tablet Take 20 mg by mouth daily.     docusate sodium (COLACE) 100 MG capsule Take 1 capsule (100 mg total) by mouth 2 (two) times daily. 10 capsule 0   hydrochlorothiazide (HYDRODIURIL) 25 MG tablet Take by mouth.     mometasone (ASMANEX) 220 MCG/INH inhaler Inhale 2 puffs into the lungs daily as needed. 1 Inhaler 5   omega-3 acid ethyl esters (LOVAZA) 1 g capsule Take 3 capsules by mouth daily.     tamsulosin (FLOMAX) 0.4 MG CAPS capsule Take 0.4 mg by mouth at bedtime.     albuterol (PROVENTIL HFA;VENTOLIN HFA) 108 (90 Base) MCG/ACT inhaler Inhale 2 puffs into the lungs every 6 (six) hours as needed. (Patient not taking: Reported on 12/14/2022) 18 g 5   colchicine 0.6 MG tablet Take 0.6 mg by mouth daily as needed (gout pain).     fluticasone (FLONASE) 50 MCG/ACT nasal spray Place into both  nostrils daily.     Current Facility-Administered Medications  Medication Dose Route Frequency Provider Last Rate Last Admin   0.9 %  sodium chloride infusion  500 mL Intravenous Once Iva Boop, MD        Allergies as of 01/06/2023 - Review Complete 01/06/2023  Allergen Reaction Noted   Amoxicillin Other (See Comments) 04/18/2021    Family History  Problem Relation Age of Onset   Hypertension Mother    Stroke Paternal Aunt    Cancer Paternal Aunt        ?   Stroke Paternal Uncle    Colon cancer Neg Hx    Prostate cancer Neg Hx    Diabetes Neg Hx    Colon polyps Neg Hx    Rectal cancer Neg Hx     Social History   Socioeconomic History   Marital status: Married    Spouse name: Not on file    Number of children: 2   Years of education: Not on file   Highest education level: Not on file  Occupational History   Occupation: Sales   Occupation: [employer]  Tobacco Use   Smoking status: Former    Types: Cigarettes    Quit date: 08/24/2006    Years since quitting: 16.3   Smokeless tobacco: Never   Tobacco comments:    still smokes , rarely, used to smoke more   Vaping Use   Vaping Use: Never used  Substance and Sexual Activity   Alcohol use: Yes    Alcohol/week: 14.0 standard drinks of alcohol    Types: 14 Cans of beer per week    Comment: not daily, beer , occ 3-4 /day   Drug use: Yes    Types: Marijuana    Comment: occasional   Sexual activity: Not on file  Other Topics Concern   Not on file  Social History Narrative   Lives w/ wife   2 independent children    Social Determinants of Health   Financial Resource Strain: Not on file  Food Insecurity: Not on file  Transportation Needs: Not on file  Physical Activity: Not on file  Stress: Not on file  Social Connections: Not on file  Intimate Partner Violence: Not on file    Review of Systems:  All other review of systems negative except as mentioned in the HPI.  Physical Exam: Vital signs BP 122/71 (BP Location: Right Arm, Patient Position: Sitting, Cuff Size: Normal)   Pulse 61   Temp (!) 97.3 F (36.3 C) (Temporal)   Ht 5\' 10"  (1.778 m)   Wt 185 lb (83.9 kg)   SpO2 97%   BMI 26.54 kg/m   General:   Alert,  Well-developed, well-nourished, pleasant and cooperative in NAD Lungs:  Clear throughout to auscultation.   Heart:  Regular rate and rhythm; no murmurs, clicks, rubs,  or gallops. Abdomen:  Soft, nontender and nondistended. Normal bowel sounds.   Neuro/Psych:  Alert and cooperative. Normal mood and affect. A and O x 3   @Milli Woolridge  Sena Slate, MD, [facility] [phone] (pager) 01/06/2023 9:11 AM@

## 2023-01-06 NOTE — Op Note (Signed)
[facility] Patient Name: Shawn Cooley Procedure Date: 01/06/2023 9:11 AM MRN: [id number] Endoscopist: Iva Boop , MD, [id number] Age: 73 Referring MD:  Date of Birth: 1951-07-14 Gender: Male Account #: [id number] Procedure:                Colonoscopy Indications:              Screening for colorectal malignant neoplasm Medicines:                Monitored Anesthesia Care Procedure:                Pre-Anesthesia Assessment:                           - Prior to the procedure, a History and Physical                            was performed, and patient medications and                            allergies were reviewed. The patient's tolerance of                            previous anesthesia was also reviewed. The risks                            and benefits of the procedure and the sedation                            options and risks were discussed with the patient.                            All questions were answered, and informed consent                            was obtained. Prior Anticoagulants: The patient has                            taken no anticoagulant or antiplatelet agents. ASA                            Grade Assessment: III - A patient with severe                            systemic disease. After reviewing the risks and                            benefits, the patient was deemed in satisfactory                            condition to undergo the procedure.                           After obtaining informed consent, the colonoscope  was passed under direct vision. Throughout the                            procedure, the patient's blood pressure, pulse, and                            oxygen saturations were monitored continuously. The                            CF [id number] #[id number] was introduced through the anus                            and advanced to the the cecum, identified by                            appendiceal  orifice and ileocecal valve. The                            colonoscopy was performed without difficulty. The                            patient tolerated the procedure well. The quality                            of the bowel preparation was good. The ileocecal                            valve, appendiceal orifice, and rectum were                            photographed. Scope In: 9:20:37 AM Scope Out: 9:34:18 AM Scope Withdrawal Time: 0 hours 10 minutes 3 seconds  Total Procedure Duration: 0 hours 13 minutes 41 seconds  Findings:                 The perianal and digital rectal examinations were                            normal. Pertinent negatives include normal prostate                            (size, shape, and consistency).                           Many diverticula were found in the sigmoid colon                            and descending colon.                           Internal hemorrhoids were found.                           The exam was otherwise without abnormality on  direct and retroflexion views. Complications:            No immediate complications. Estimated Blood Loss:     Estimated blood loss: none. Impression:               - Severe diverticulosis in the sigmoid colon and in                            the descending colon.                           - Internal hemorrhoids.                           - The examination was otherwise normal on direct                            and retroflexion views.                           - No specimens collected. Recommendation:           - Patient has a contact number available for                            emergencies. The signs and symptoms of potential                            delayed complications were discussed with the                            patient. Return to normal activities tomorrow.                            Written discharge instructions were provided to the                             patient.                           - Resume previous diet.                           - Continue present medications.                           - No repeat colonoscopy due to current age (61                            years or older) and the absence of colonic polyps. Iva Boop, MD 01/06/2023 9:43:21 AM This report has been signed electronically.

## 2023-01-06 NOTE — Progress Notes (Signed)
To pacu, VSS. Report to [email or web address] 

## 2023-01-07 ENCOUNTER — Telehealth: Payer: Self-pay

## 2023-01-07 NOTE — Telephone Encounter (Signed)
Attempted f/u call. No answer, left VM. 

## 2023-01-17 ENCOUNTER — Emergency Department (HOSPITAL_BASED_OUTPATIENT_CLINIC_OR_DEPARTMENT_OTHER): Payer: Federal, State, Local not specified - PPO

## 2023-01-17 ENCOUNTER — Encounter (HOSPITAL_COMMUNITY): Payer: Self-pay

## 2023-01-17 ENCOUNTER — Emergency Department (HOSPITAL_COMMUNITY)
Admission: EM | Admit: 2023-01-17 | Discharge: 2023-01-17 | Disposition: A | Discharge status: Home / Self Care | Payer: Federal, State, Local not specified - PPO | Attending: Emergency Medicine | Admitting: Emergency Medicine

## 2023-01-17 DIAGNOSIS — M79605 Pain in left leg: Secondary | ICD-10-CM | POA: Diagnosis present

## 2023-01-17 DIAGNOSIS — Z79899 Other long term (current) drug therapy: Secondary | ICD-10-CM | POA: Diagnosis not present

## 2023-01-17 DIAGNOSIS — M7989 Other specified soft tissue disorders: Secondary | ICD-10-CM

## 2023-01-17 DIAGNOSIS — N189 Chronic kidney disease, unspecified: Secondary | ICD-10-CM | POA: Diagnosis not present

## 2023-01-17 DIAGNOSIS — Z8546 Personal history of malignant neoplasm of prostate: Secondary | ICD-10-CM | POA: Diagnosis not present

## 2023-01-17 DIAGNOSIS — Z7901 Long term (current) use of anticoagulants: Secondary | ICD-10-CM | POA: Insufficient documentation

## 2023-01-17 DIAGNOSIS — I824Z2 Acute embolism and thrombosis of unspecified deep veins of left distal lower extremity: Secondary | ICD-10-CM | POA: Diagnosis not present

## 2023-01-17 DIAGNOSIS — I129 Hypertensive chronic kidney disease with stage 1 through stage 4 chronic kidney disease, or unspecified chronic kidney disease: Secondary | ICD-10-CM | POA: Insufficient documentation

## 2023-01-17 LAB — CBC WITH DIFFERENTIAL/PLATELET
Abs Immature Granulocytes: 0.02 10*3/uL (ref 0.00–0.07)
Basophils Absolute: 0.1 10*3/uL (ref 0.0–0.1)
Basophils Relative: 1 %
Eosinophils Absolute: 0.3 10*3/uL (ref 0.0–0.5)
Eosinophils Relative: 4 %
HCT: 40.5 % (ref 39.0–52.0)
Hemoglobin: 12.8 g/dL — ABNORMAL LOW (ref 13.0–17.0)
Immature Granulocytes: 0 %
Lymphocytes Relative: 23 %
Lymphs Abs: 1.9 10*3/uL (ref 0.7–4.0)
MCH: 30.3 pg (ref 26.0–34.0)
MCHC: 31.6 g/dL (ref 30.0–36.0)
MCV: 95.7 fL (ref 80.0–100.0)
Monocytes Absolute: 1.2 10*3/uL — ABNORMAL HIGH (ref 0.1–1.0)
Monocytes Relative: 14 %
Neutro Abs: 4.9 10*3/uL (ref 1.7–7.7)
Neutrophils Relative %: 58 %
Platelets: 203 10*3/uL (ref 150–400)
RBC: 4.23 MIL/uL (ref 4.22–5.81)
RDW: 12.9 % (ref 11.5–15.5)
WBC: 8.4 10*3/uL (ref 4.0–10.5)
nRBC: 0 % (ref 0.0–0.2)

## 2023-01-17 LAB — BASIC METABOLIC PANEL
Anion gap: 10 (ref 5–15)
BUN: 48 mg/dL — ABNORMAL HIGH (ref 8–23)
CO2: 21 mmol/L — ABNORMAL LOW (ref 22–32)
Calcium: 9.4 mg/dL (ref 8.9–10.3)
Chloride: 107 mmol/L (ref 98–111)
Creatinine, Ser: 2.34 mg/dL — ABNORMAL HIGH (ref 0.61–1.24)
GFR, Estimated: 29 mL/min — ABNORMAL LOW (ref >60)
Glucose, Bld: 85 mg/dL (ref 70–99)
Potassium: 4.6 mmol/L (ref 3.5–5.1)
Sodium: 138 mmol/L (ref 135–145)

## 2023-01-17 MED ORDER — APIXABAN (ELIQUIS) VTE STARTER PACK (10MG AND 5MG): ORAL_TABLET | ORAL | 0 refills | Status: DC

## 2023-01-17 MED ORDER — APIXABAN 5 MG PO TABS
10.0000 mg | ORAL_TABLET | Freq: Once | ORAL | Status: AC
  Administered 2023-01-17: 10 mg via ORAL
  Filled 2023-01-17: qty 2

## 2023-01-17 NOTE — Progress Notes (Signed)
VASCULAR LAB    Left lower extremity venous duplex has been performed.  See CV proc for preliminary results.  Messaged results to Dr. Durwin Nora and gave verbal report to [place], [place]  [facility], [facility], RVT 01/17/2023, 2:28 PM

## 2023-01-17 NOTE — Discharge Instructions (Signed)
You received your first dose of Eliquis here in the ED.  You will need to resume twice a day dosing of Eliquis.  A starter pack was sent to your pharmacy.  You should follow-up with your PCP and your vascular doctor.  You will need refills on this medication for what will likely be lifelong anticoagulation.  Return to the emergency department for any new or worsening symptoms of concern.

## 2023-01-17 NOTE — ED Triage Notes (Signed)
Pt c/o left calf pain. Pt states pain started yesterday, along with swelling.   Pt has hx of DVT, was taken off of Eliquis approx 8 months ago.

## 2023-01-17 NOTE — ED Provider Notes (Signed)
[place] EMERGENCY DEPARTMENT AT [facility] Provider Note   CSN: [id number] Arrival date & time: 01/17/23  1207     History  Chief Complaint  Patient presents with   Leg Pain    Shawn Cooley is a 72 y.o. male.   Leg Pain Patient presents for left leg pain and swelling.  Medical history includes HTN, HLD, prostate cancer, CKD, prior DVT.  He had a DVT in the right leg last year.  He underwent treatment with Eliquis.  During follow-up exam, he had resolution of his right leg DVTs.  He has had persistent swelling to his right leg, despite resolution of clots.  He was taken off Eliquis.  He estimates that this was 8 months ago.  Yesterday, when he woke up, he noticed some swelling to his left leg.  He has had pain and tenderness to the area of the calf.  Pain does not extend above the knee.  Symptoms persisted today, prompting him to come to the ED.  Patient denies any other recent symptoms, including any chest pain or shortness of breath.  He denies any recent travel or injuries.     Home Medications Prior to Admission medications   Medication Sig Start Date End Date Taking? Authorizing Provider  albuterol (PROVENTIL HFA;VENTOLIN HFA) 108 (90 Base) MCG/ACT inhaler Inhale 2 puffs into the lungs every 6 (six) hours as needed. Patient not taking: Reported on 12/14/2022 07/30/16   Wanda Plump, MD  allopurinol (ZYLOPRIM) 100 MG tablet Take 1 tablet (100 mg total) by mouth daily. 02/15/17   Wanda Plump, MD  amLODipine-benazepril (LOTREL) 10-40 MG capsule Take 1 capsule by mouth daily. 04/02/21   [provider]  APIXABAN Everlene Balls) VTE STARTER PACK (10MG  AND 5MG ) Take as directed on package: start with two-5mg  tablets twice daily for 7 days. On day 8, switch to one-5mg  tablet twice daily. 01/17/23  Yes Gloris Manchester, MD  atorvastatin (LIPITOR) 20 MG tablet Take 20 mg by mouth daily.    [provider]  colchicine 0.6 MG tablet Take 0.6 mg by mouth daily as needed  (gout pain).    [provider]  docusate sodium (COLACE) 100 MG capsule Take 1 capsule (100 mg total) by mouth 2 (two) times daily. 04/20/21   Regalado, Belkys A, MD  fluticasone (FLONASE) 50 MCG/ACT nasal spray Place into both nostrils daily.    [provider]  hydrochlorothiazide (HYDRODIURIL) 25 MG tablet Take by mouth. 01/03/21   [provider]  mometasone (ASMANEX) 220 MCG/INH inhaler Inhale 2 puffs into the lungs daily as needed. 07/30/16   Wanda Plump, MD  omega-3 acid ethyl esters (LOVAZA) 1 g capsule Take 3 capsules by mouth daily. 11/27/20   [provider]  tamsulosin (FLOMAX) 0.4 MG CAPS capsule Take 0.4 mg by mouth at bedtime.    [provider]      Allergies    Amoxicillin    Review of Systems   Review of Systems  Cardiovascular:  Positive for leg swelling.  Musculoskeletal:  Positive for myalgias.  All other systems reviewed and are negative.   Physical Exam Updated Vital Signs BP (!) 163/96   Pulse 77   Temp 98.1 F (36.7 C) (Oral)   Resp 20   Ht 5\' 10"  (1.778 m)   Wt 79.4 kg   SpO2 100%   BMI 25.11 kg/m  Physical Exam Vitals and nursing note reviewed.  Constitutional:      General: He  is not in acute distress.    Appearance: Normal appearance. He is well-developed. He is not ill-appearing, toxic-appearing or diaphoretic.  HENT:     Head: Normocephalic and atraumatic.     Right Ear: External ear normal.     Left Ear: External ear normal.     Nose: Nose normal.     Mouth/Throat:     Mouth: Mucous membranes are moist.  Eyes:     Extraocular Movements: Extraocular movements intact.     Conjunctiva/sclera: Conjunctivae normal.  Cardiovascular:     Rate and Rhythm: Normal rate and regular rhythm.  Pulmonary:     Effort: Pulmonary effort is normal. No respiratory distress.  Abdominal:     General: There is no distension.     Palpations: Abdomen is soft.  Musculoskeletal:        General: Swelling and tenderness  present. Normal range of motion.     Cervical back: Normal range of motion.  Skin:    General: Skin is warm and dry.     Coloration: Skin is not jaundiced or pale.  Neurological:     General: No focal deficit present.     Mental Status: He is alert and oriented to person, place, and time.     Cranial Nerves: No cranial nerve deficit.     Sensory: No sensory deficit.     Motor: No weakness.     Coordination: Coordination normal.  Psychiatric:        Mood and Affect: Mood normal.        Behavior: Behavior normal.        Thought Content: Thought content normal.        Judgment: Judgment normal.     ED Results / Procedures / Treatments   Labs (all labs ordered are listed, but only abnormal results are displayed) Labs Reviewed  CBC WITH DIFFERENTIAL/PLATELET - Abnormal; Notable for the following components:      Result Value   Hemoglobin 12.8 (*)    Monocytes Absolute 1.2 (*)    All other components within normal limits  BASIC METABOLIC PANEL - Abnormal; Notable for the following components:   CO2 21 (*)    BUN 48 (*)    Creatinine, Ser 2.34 (*)    GFR, Estimated 29 (*)    All other components within normal limits    EKG None  Radiology VAS [place] LOWER EXTREMITY VENOUS (DVT) (ONLY [facility] & [facility])  Result Date: 01/17/2023  Lower Venous DVT Study Patient Name:  Shawn Cooley  Date of Exam:   01/17/2023 Medical Rec #: [id number]         Accession #:    [id number] Date of Birth: 21-Dec-1950        Patient Gender: M Patient Age:   48 years Exam Location:  [facility] Procedure:      VAS [place] LOWER EXTREMITY VENOUS (DVT) Referring Phys: Glynn Octave --------------------------------------------------------------------------------  Indications: Pain, and Swelling.  Risk Factors: DVT 07/04/21 Right LE. Comparison Study: No prior left lower extremity venous duplex Performing Technologist: Sherren Kerns RVS  Examination Guidelines: A complete evaluation includes B-mode imaging, spectral  Doppler, color Doppler, and power Doppler as needed of all accessible portions of each vessel. Bilateral testing is considered an integral part of a complete examination. Limited examinations for reoccurring indications may be performed as noted. The reflux portion of the exam is performed with the patient in reverse Trendelenburg.  +-----+---------------+---------+-----------+----------+--------------+ RIGHTCompressibilityPhasicitySpontaneityPropertiesThrombus Aging +-----+---------------+---------+-----------+----------+--------------+ CFV  Full  Yes      Yes                                 +-----+---------------+---------+-----------+----------+--------------+   +---------+---------------+---------+-----------+----------+--------------+ LEFT     CompressibilityPhasicitySpontaneityPropertiesThrombus Aging +---------+---------------+---------+-----------+----------+--------------+ CFV      Full           Yes      Yes                                 +---------+---------------+---------+-----------+----------+--------------+ SFJ      Full                                                        +---------+---------------+---------+-----------+----------+--------------+ FV Prox  Full                                                        +---------+---------------+---------+-----------+----------+--------------+ FV Mid   Full           Yes      Yes                                 +---------+---------------+---------+-----------+----------+--------------+ FV DistalFull                                                        +---------+---------------+---------+-----------+----------+--------------+ PFV      Full                                                        +---------+---------------+---------+-----------+----------+--------------+ POP      None           No       No                   Acute           +---------+---------------+---------+-----------+----------+--------------+ PTV      None           No       No                   Acute          +---------+---------------+---------+-----------+----------+--------------+ PERO     None           No       No                   Acute          +---------+---------------+---------+-----------+----------+--------------+ Gastroc  Full                                                        +---------+---------------+---------+-----------+----------+--------------+  Summary: RIGHT: - No evidence of deep vein thrombosis in the lower extremity. No indirect evidence of obstruction proximal to the inguinal ligament.  LEFT: - Findings consistent with acute deep vein thrombosis involving the left popliteal vein, left posterior tibial veins, and left peroneal veins. - No cystic structure found in the popliteal fossa.  *See table(s) above for measurements and observations.    Preliminary     Procedures Procedures    Medications Ordered in ED Medications  apixaban (ELIQUIS) tablet 10 mg (10 mg Oral Given 01/17/23 1519)    ED Course/ Medical Decision Making/ A&P                             Medical Decision Making Amount and/or Complexity of Data Reviewed Labs: ordered.  Risk Prescription drug management.   Patient presents for left leg pain and swelling since yesterday.  He does have a history of DVT that occurred in his right leg last year.  Right leg DVT has since resolved and he was taken off of Eliquis.  Today, prior to being bedded in the ED, he underwent DVT ultrasound study of his left leg.  Results of study was positive for clots in popliteal, peroneal, and posterior tibial veins.  On exam, patient is well-appearing.  It is hard to appreciate swelling given that his right leg stays chronically swollen.  Patient was restarted on Eliquis.  He was informed that we will likely need to stay on anticoagulation for life.  He does have a  history of CKD and lab work was ordered to assess kidney function.  Because of his CKD, he is unable to undergo contrasted studies.  Patient denies any recent pulmonary symptoms.  Currently, vital signs are normal.  SpO2 is normal on room air.  I have low suspicion for any significant clot burden in his lungs.  Lab work shows baseline CKD.  Patient is stable for discharge.  Eliquis starter pack was prescribed.        Final Clinical Impression(s) / ED Diagnoses Final diagnoses:  Acute deep vein thrombosis (DVT) of distal vein of left lower extremity (HCC)    Rx / DC Orders ED Discharge Orders          Ordered    APIXABAN (ELIQUIS) VTE STARTER PACK (10MG  AND 5MG )       Note to Pharmacy: If starter pack unavailable, substitute with seventy-four 5 mg apixaban tabs following the above SIG directions.   01/17/23 1512              Gloris Manchester, MD 01/17/23 [phone]

## 2023-01-21 ENCOUNTER — Ambulatory Visit: Payer: Federal, State, Local not specified - PPO | Admitting: Vascular Surgery

## 2023-01-21 ENCOUNTER — Encounter: Payer: Self-pay | Admitting: Vascular Surgery

## 2023-01-21 VITALS — BP 128/87 | HR 68 | Temp 98.1°F | Resp 20 | Ht 70.0 in | Wt 186.0 lb

## 2023-01-21 DIAGNOSIS — I82432 Acute embolism and thrombosis of left popliteal vein: Secondary | ICD-10-CM | POA: Diagnosis not present

## 2023-01-21 NOTE — Progress Notes (Signed)
REASON FOR VISIT:   New left lower remedy DVT.  The consult is requested by the emergency department.  MEDICAL ISSUES:   LEFT POPLITEAL AND CALF DVT: This patient had an unprovoked left popliteal DVT that was diagnosed on 01/17/2023.  He is now on Eliquis.  Given his previous DVT on the right leg I think we need to continue his Eliquis for at least 6 months before getting a follow-up duplex scan of both lower extremities.  At that point we could consider holding his Eliquis and getting a hematologic workup given that this most recent DVT was unprovoked.  Of note he does have a history of prostate cancer.  The patient was previously seen by Dr. Chestine Spore with a right lower extremity DVT.  Given that I am retiring I will have him follow-up with Dr. Chestine Spore.  Of note we have discussed the importance of leg elevation.  I also recommended a knee-high compression stocking with a gradient of 15 to 20 mmHg.  He did have a trip to [place] scheduled and he was post to leave next week but I do not think that is safe so we will write him a letter to get him covered by his insurance so he can reschedule his trip when it is safe.   HPI:   Shawn Cooley is a pleasant 72 y.o. male who has a previous history of a right lower extremity DVT that was diagnosed in 2022.  At that time he had injured his right leg and developed an extensive right lower extremity DVT.  He was treated Eliquis for 6 months.  Of note he was evaluated by Dr. Chestine Spore in the office after he had the clot for some time.   On 01/15/2023, he noted some swelling and pain in the left calf.  He had a venous duplex scan on 01/17/2023 which showed clot in the left popliteal vein and posterior tibial and peroneal veins.  He was started on Eliquis by the emergency department and set up for an office visit with [place].  Of note he denies any chest pain or shortness of breath.  He does not remember any injury to the leg.  He had no recent long travel or immobilization.   He does have a history of prostate cancer.  He had undergone a colonoscopy a couple weeks ago.  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   Stroke Paternal Uncle    Colon cancer Neg Hx    Prostate cancer Neg Hx    Diabetes Neg Hx    Colon polyps Neg Hx    Rectal cancer Neg Hx     SOCIAL HISTORY: Social History   Tobacco Use   Smoking status: Former    Types: Cigarettes    Quit date: 08/24/2006    Years since quitting: 16.4   Smokeless tobacco: Never   Tobacco comments:    still smokes , rarely, used to smoke more   Substance Use Topics  Alcohol use: Yes    Alcohol/week: 14.0 standard drinks of alcohol    Types: 14 Cans of beer per week    Comment: not daily, beer , occ 3-4 /day    Allergies  Allergen Reactions   Amoxicillin Other (See Comments)    Kidney related issue Kidney related issue    Current Outpatient Medications  Medication Sig Dispense Refill   albuterol (PROVENTIL HFA;VENTOLIN HFA) 108 (90 Base) MCG/ACT inhaler Inhale 2 puffs into the lungs every 6 (six) hours as needed. 18 g 5   allopurinol (ZYLOPRIM) 100 MG tablet Take 1 tablet (100 mg total) by mouth daily. 30 tablet 0   amLODipine-benazepril (LOTREL) 10-40 MG capsule Take 1 capsule by mouth daily.     APIXABAN (ELIQUIS) VTE STARTER PACK (10MG  AND 5MG ) Take as directed on package: start with two-5mg  tablets twice daily for 7 days. On day 8, switch to one-5mg  tablet twice daily. 74 each 0   atorvastatin (LIPITOR) 20 MG tablet Take 20 mg by mouth daily.     colchicine 0.6 MG tablet Take 0.6 mg by mouth daily as needed (gout pain).      docusate sodium (COLACE) 100 MG capsule Take 1 capsule (100 mg total) by mouth 2 (two) times daily. 10 capsule 0   fluticasone (FLONASE) 50 MCG/ACT nasal spray Place into both nostrils daily.     hydrochlorothiazide (HYDRODIURIL) 25 MG tablet Take by mouth.     mometasone (ASMANEX) 220 MCG/INH inhaler Inhale 2 puffs into the lungs daily as needed. 1 Inhaler 5   omega-3 acid ethyl esters (LOVAZA) 1 g capsule Take 3 capsules by mouth daily.     tamsulosin (FLOMAX) 0.4 MG CAPS capsule Take 0.4 mg by mouth at bedtime.     No current facility-administered medications for this visit.    REVIEW OF SYSTEMS:  [X]  denotes positive finding, [ ]  denotes negative finding Cardiac  Comments:  Chest pain or chest pressure:    Shortness of breath upon exertion:    Short of breath when lying flat:    Irregular heart rhythm:        Vascular    Pain in calf, thigh, or hip brought on by ambulation:    Pain in feet at night that wakes you up from your sleep:     Blood clot in your veins:    Leg swelling:  x       Pulmonary    Oxygen at home:    Productive cough:     Wheezing:         Neurologic    Sudden weakness in arms or legs:     Sudden numbness in arms or legs:     Sudden onset of difficulty speaking or slurred speech:    Temporary loss of vision in one eye:     Problems with dizziness:         Gastrointestinal    Blood in stool:     Vomited blood:         Genitourinary    Burning when urinating:     Blood in urine:        Psychiatric    Major depression:         Hematologic    Bleeding problems:    Problems with blood clotting too easily:        Skin    Rashes or ulcers:        Constitutional    Fever or chills:  PHYSICAL EXAM:   Vitals:   01/21/23 1104  BP: 128/87  Pulse: 68  Resp: 20  Temp: 98.1 F (36.7 C)  SpO2: 97%  Weight: 186 lb (84.4 kg)  Height: 5\' 10"  (1.778 m)    GENERAL: The patient is a well-nourished male, in no acute distress. The vital signs  are documented above. CARDIAC: There is a regular rate and rhythm.  VASCULAR: I do not detect carotid bruits. He has palpable pedal pulses. He has bilateral calf swelling. PULMONARY: There is good air exchange bilaterally without wheezing or rales. ABDOMEN: Soft and non-tender with normal pitched bowel sounds.  MUSCULOSKELETAL: There are no major deformities or cyanosis. NEUROLOGIC: No focal weakness or paresthesias are detected. SKIN: There are no ulcers or rashes noted. PSYCHIATRIC: The patient has a normal affect.  DATA:    VENOUS DUPLEX: I reviewed his venous duplex scan that was done on 01/17/2023.  This showed an acute DVT involving the left popliteal vein, posterior tibial vein, and peroneal veins.  Waverly Ferrari [facility] [phone]

## 2023-01-26 ENCOUNTER — Other Ambulatory Visit: Payer: Self-pay

## 2023-01-26 DIAGNOSIS — I82411 Acute embolism and thrombosis of right femoral vein: Secondary | ICD-10-CM

## 2023-01-26 DIAGNOSIS — I82432 Acute embolism and thrombosis of left popliteal vein: Secondary | ICD-10-CM

## 2023-02-11 ENCOUNTER — Other Ambulatory Visit: Payer: Self-pay | Admitting: Vascular Surgery

## 2023-04-21 ENCOUNTER — Ambulatory Visit
Admission: RE | Admit: 2023-04-21 | Discharge: 2023-04-21 | Disposition: A | Discharge status: Home / Self Care | Payer: Federal, State, Local not specified - PPO | Source: Ambulatory Visit | Attending: Urology | Admitting: Urology

## 2023-04-21 ENCOUNTER — Other Ambulatory Visit: Payer: Federal, State, Local not specified - PPO

## 2023-04-21 DIAGNOSIS — C61 Malignant neoplasm of prostate: Secondary | ICD-10-CM

## 2023-04-21 MED ORDER — GADOPICLENOL 0.5 MMOL/ML IV SOLN
8.0000 mL | Freq: Once | INTRAVENOUS | Status: AC | PRN
  Administered 2023-04-21: 8 mL via INTRAVENOUS

## 2023-04-30 ENCOUNTER — Ambulatory Visit
Admission: RE | Admit: 2023-04-30 | Discharge: 2023-04-30 | Disposition: A | Discharge status: Home / Self Care | Payer: Federal, State, Local not specified - PPO | Source: Ambulatory Visit | Attending: Urology | Admitting: Urology

## 2023-04-30 DIAGNOSIS — N281 Cyst of kidney, acquired: Secondary | ICD-10-CM

## 2023-04-30 MED ORDER — GADOPICLENOL 0.5 MMOL/ML IV SOLN
8.0000 mL | Freq: Once | INTRAVENOUS | Status: AC | PRN
  Administered 2023-04-30: 8 mL via INTRAVENOUS

## 2023-05-25 NOTE — Progress Notes (Signed)
Fax received from [facility] on 05/21/23 for medical clearance/medication hold for MRI fusion bx to be signed by C. Chestine Spore, MD.  Provider signed on 05/25/23, scanned into pt's chart, media routed via MyChart to sender on 05/25/23.

## 2023-07-06 ENCOUNTER — Encounter (HOSPITAL_COMMUNITY): Payer: Federal, State, Local not specified - PPO

## 2023-07-06 ENCOUNTER — Ambulatory Visit: Payer: Federal, State, Local not specified - PPO | Admitting: Vascular Surgery

## 2023-07-27 ENCOUNTER — Ambulatory Visit (HOSPITAL_COMMUNITY)
Admission: RE | Admit: 2023-07-27 | Discharge: 2023-07-27 | Disposition: A | Discharge status: Home / Self Care | Payer: Federal, State, Local not specified - PPO | Source: Ambulatory Visit | Attending: Vascular Surgery | Admitting: Vascular Surgery

## 2023-07-27 ENCOUNTER — Ambulatory Visit: Payer: Federal, State, Local not specified - PPO | Admitting: Vascular Surgery

## 2023-07-27 DIAGNOSIS — I82411 Acute embolism and thrombosis of right femoral vein: Secondary | ICD-10-CM | POA: Insufficient documentation

## 2023-07-27 DIAGNOSIS — I82432 Acute embolism and thrombosis of left popliteal vein: Secondary | ICD-10-CM | POA: Insufficient documentation

## 2023-08-09 NOTE — Progress Notes (Signed)
Patient name: Shawn Cooley MRN: [id number] DOB: May 12, 1951 Sex: male  REASON FOR CONSULT: Follow-up lower extremity DVT  HPI: Shawn Cooley is a 72 y.o. male, with history of hypertension, hyperlipidemia, prostate cancer, DVT that presents for follow-up of his DVT.  Patient was initially diagnosed with a right lower extremity DVT in 2022 and was on 6 months of Eliquis.  This was felt to be a first time provoked DVT.  He then saw Dr. Edilia Bo on 12/28/2022 with a DVT in the left leg involving the popliteal, posterior tibial, and peroneal vein.  This was felt to be unprovoked.  He was placed back on Eliquis.  States he recently saw hematology oncology and had a negative hypercoagulable workup.  Still has prostate cancer but is not undergoing treatment.  Does complain of some swelling in the right leg.  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   Stroke Paternal Uncle    Colon cancer Neg Hx    Prostate cancer Neg Hx    Diabetes Neg Hx    Colon polyps Neg Hx    Rectal cancer Neg Hx     SOCIAL HISTORY: Social History   Socioeconomic History   Marital status: Married    Spouse name: Not on file   Number of children: 2   Years of education: Not on file   Highest education level: Not on file  Occupational History   Occupation: Sales   Occupation: [employer]  Tobacco Use   Smoking status: Former    Current packs/day: 0.00    Types: Cigarettes    Quit date: 08/24/2006    Years since  quitting: 16.9   Smokeless tobacco: Never   Tobacco comments:    still smokes , rarely, used to smoke more   Vaping Use   Vaping status: Never Used  Substance and Sexual Activity   Alcohol use: Yes    Alcohol/week: 14.0 standard drinks of alcohol    Types: 14 Cans of beer per week    Comment: not daily, beer , occ 3-4 /day   Drug use: Yes    Types: Marijuana    Comment: occasional   Sexual activity: Not on file  Other Topics Concern   Not on file  Social History Narrative   Lives w/ wife   2 independent children    Social Drivers of Health   Financial Resource Strain: Low Risk  (02/08/2023)   Received from [facility], [facility]   Overall Financial Resource Strain (CARDIA)    Difficulty of Paying Living Expenses: Not hard at all  Food Insecurity: No Food Insecurity (02/08/2023)   Received from [facility], [facility]   Hunger Vital Sign    Worried About Running Out of Food in the Last Year: Never true  Ran Out of Food in the Last Year: Never true  Transportation Needs: No Transportation Needs (02/08/2023)   Received from [facility], [facility]   PRAPARE - Transportation    Lack of Transportation (Medical): No    Lack of Transportation (Non-Medical): No  Physical Activity: Insufficiently Active (02/08/2023)   Received from [facility], [facility]   Exercise Vital Sign    Days of Exercise per Week: 3 days    Minutes of Exercise per Session: 40 min  Stress: No Stress Concern Present (02/08/2023)   Received from [facility], [facility] - Occupational Stress Questionnaire    Feeling of Stress : Not at all  Social Connections: Socially Integrated (02/08/2023)   Received from [facility], [facility]   Social Network    How would you rate your social network (family, work, friends)?: Good participation with social networks  Intimate Partner Violence: Not At Risk (02/08/2023)   Received from [facility],  [facility]   HITS    Over the last 12 months how often did your partner physically hurt you?: Never    Over the last 12 months how often did your partner insult you or talk down to you?: Never    Over the last 12 months how often did your partner threaten you with physical harm?: Never    Over the last 12 months how often did your partner scream or curse at you?: Never    Allergies  Allergen Reactions   Amoxicillin Other (See Comments)    Kidney related issue Kidney related issue    Current Outpatient Medications  Medication Sig Dispense Refill   albuterol (PROVENTIL HFA;VENTOLIN HFA) 108 (90 Base) MCG/ACT inhaler Inhale 2 puffs into the lungs every 6 (six) hours as needed. 18 g 5   allopurinol (ZYLOPRIM) 100 MG tablet Take 1 tablet (100 mg total) by mouth daily. 30 tablet 0   amLODipine-benazepril (LOTREL) 10-40 MG capsule Take 1 capsule by mouth daily.     apixaban (ELIQUIS) 5 MG TABS tablet TAKE 1 TABLET BY MOUTH TWICE A DAY 60 tablet 4   atorvastatin (LIPITOR) 20 MG tablet Take 20 mg by mouth daily.     colchicine 0.6 MG tablet Take 0.6 mg by mouth daily as needed (gout pain).     docusate sodium (COLACE) 100 MG capsule Take 1 capsule (100 mg total) by mouth 2 (two) times daily. 10 capsule 0   fluticasone (FLONASE) 50 MCG/ACT nasal spray Place into both nostrils daily.     hydrochlorothiazide (HYDRODIURIL) 25 MG tablet Take by mouth.     mometasone (ASMANEX) 220 MCG/INH inhaler Inhale 2 puffs into the lungs daily as needed. 1 Inhaler 5   omega-3 acid ethyl esters (LOVAZA) 1 g capsule Take 3 capsules by mouth daily.     tamsulosin (FLOMAX) 0.4 MG CAPS capsule Take 0.4 mg by mouth at bedtime.     No current facility-administered medications for this visit.    REVIEW OF SYSTEMS:  [X]  denotes positive finding, [ ]  denotes negative finding Cardiac  Comments:  Chest pain or chest pressure:    Shortness of breath upon exertion:    Short of breath when lying flat:    Irregular  heart rhythm:        Vascular    Pain in calf, thigh, or hip brought on by ambulation:    Pain in feet at night that wakes you up from your sleep:  Blood clot in your veins:    Leg swelling:  x Right      Pulmonary    Oxygen at home:    Productive cough:     Wheezing:         Neurologic    Sudden weakness in arms or legs:     Sudden numbness in arms or legs:     Sudden onset of difficulty speaking or slurred speech:    Temporary loss of vision in one eye:     Problems with dizziness:         Gastrointestinal    Blood in stool:     Vomited blood:         Genitourinary    Burning when urinating:     Blood in urine:        Psychiatric    Major depression:         Hematologic    Bleeding problems:    Problems with blood clotting too easily:        Skin    Rashes or ulcers:        Constitutional    Fever or chills:      PHYSICAL EXAM: There were no vitals filed for this visit.  GENERAL: The patient is a well-nourished male, in no acute distress. The vital signs are documented above. CARDIAC: There is a regular rate and rhythm.  VASCULAR:  Bilateral femoral pulses palpable Bilateral PT pulses palpable PULMONARY: No respiratory distress. ABDOMEN: Soft and non-tender. MUSCULOSKELETAL: There are no major deformities or cyanosis. NEUROLOGIC: No focal weakness or paresthesias are detected. SKIN: There are no ulcers or rashes noted. PSYCHIATRIC: The patient has a normal affect.  DATA:   Lower Venous DVT Study   Patient Name:  Shawn Cooley  Date of Exam:   07/27/2023  Medical Rec #: [id number]         Accession #:    [id number]  Date of Birth: 11-07-1950        Patient Gender: M  Patient Age:   61 years  Exam Location:  [facility]  Procedure:      VAS [place] LOWER EXTREMITY VENOUS (DVT)  Referring Phys: Cristal Deer DICKSON    ---------------------------------------------------------------------------  -----    Indications: F/u dvt.     Comparison Study: 01/17/23-                    Summary:                    RIGHT:                    - No evidence of deep vein thrombosis in the lower  extremity.                   No indirect                    evidence of obstruction proximal to the inguinal  ligament.                     LEFT:                    - Findings consistent with acute deep vein thrombosis                    involving the left  popliteal vein, left posterior tibial veins, and left  peroneal                   veins.                    - No cystic structure found in the popliteal fossa.                      07/04/21-                    Summary:                    RIGHT:                    - Findings consistent with age indeterminate deep vein                    thrombosis                    involving the right femoral vein, and right popliteal  vein.                   Patent and compressible external iliac vein and common  femoral                   vein.   Performing Technologist: [place] Ponder RVS     Examination Guidelines:  A complete evaluation includes B-mode imaging, spectral Doppler, color  Doppler,  and power Doppler as needed of all accessible portions of each vessel.  Bilateral  testing is considered an integral part of a complete examination. Limited  examinations for reoccurring indications may be performed as noted. The  reflux  portion of the exam is performed with the patient in reverse  Trendelenburg.      +---------+---------------+---------+-----------+----------+---------------  --+  RIGHT   CompressibilityPhasicitySpontaneityPropertiesThrombus Aging      +---------+---------------+---------+-----------+----------+---------------  --+  CFV     Full           Yes      Yes                                      +---------+---------------+---------+-----------+----------+---------------  --+  SFJ     Full                                                              +---------+---------------+---------+-----------+----------+---------------  --+  FV Prox  Partial                                      Age  Indeterminate  +---------+---------------+---------+-----------+----------+---------------  --+  FV Mid   Full                                                             +---------+---------------+---------+-----------+----------+---------------  --+  FV DistalPartial  Yes      Yes                  Age  Indeterminate  +---------+---------------+---------+-----------+----------+---------------  --+  PFV     Full                                                             +---------+---------------+---------+-----------+----------+---------------  --+  POP     Full           Yes      Yes                                      +---------+---------------+---------+-----------+----------+---------------  --+  PTV     Full                                                             +---------+---------------+---------+-----------+----------+---------------  --+  PERO    Full           Yes      Yes                                      +---------+---------------+---------+-----------+----------+---------------  --+      +---------+---------------+---------+-----------+----------+--------------+   LEFT    CompressibilityPhasicitySpontaneityPropertiesThrombus  Aging  +---------+---------------+---------+-----------+----------+--------------+   CFV     Full           Yes      Yes                                   +---------+---------------+---------+-----------+----------+--------------+   SFJ     Full                                                          +---------+---------------+---------+-----------+----------+--------------+   FV Prox  Full                                                           +---------+---------------+---------+-----------+----------+--------------+   FV Mid   Full                                                          +---------+---------------+---------+-----------+----------+--------------+   FV DistalFull                                                          +---------+---------------+---------+-----------+----------+--------------+  PFV     Full                                                          +---------+---------------+---------+-----------+----------+--------------+   POP     Full           Yes      Yes                                   +---------+---------------+---------+-----------+----------+--------------+   PTV     Full                                                          +---------+---------------+---------+-----------+----------+--------------+   PERO    Full                                                          +---------+---------------+---------+-----------+----------+--------------+   Summary:  RIGHT:   - Findings consistent with age indeterminate deep vein thrombosis  involving the right femoral vein.    - No cystic structure found in the popliteal fossa.    LEFT:   - There is no evidence of deep vein thrombosis in the lower extremity.    - No cystic structure found in the popliteal fossa.     *See table(s) above for measurements and observations.   Electronically signed by Heath Lark on 07/27/2023 at 4:52:31 PM.   Assessment/Plan:  72 y.o. male, with history of hypertension, hyperlipidemia, prostate cancer, DVT that presents for follow-up of his DVT.  Patient was initially diagnosed with a right lower extremity DVT in 2022 and was on 6 months of Eliquis.  This was felt to be a first time provoked DVT.  He then saw Dr. Edilia Bo on 12/28/2022 with a DVT in the left leg involving the popliteal, posterior tibial, and peroneal vein.  This was felt to be  unprovoked.  He was placed back on Eliquis.  I discussed with a second DVT we now recommend lifelong anticoagulation.  He has already seen hematology oncology and had a negative hypercoagulable workup.  I refilled his Eliquis today at 5 mg twice daily.  I discussed for his right leg swelling this is likely related to valvular damage from his prior DVT.  Discussed elevation with exercise and compression stockings.  He can follow-up with [place] as needed or if he has worsening symptoms.  Discussed the DVT in his left leg is in the popliteal and tibial veins and would not recommend any surgery for this.   Cephus Shelling, MD [facility] [place] Office: [phone]

## 2023-08-10 ENCOUNTER — Encounter: Payer: Self-pay | Admitting: Vascular Surgery

## 2023-08-10 ENCOUNTER — Ambulatory Visit: Payer: Federal, State, Local not specified - PPO | Admitting: Vascular Surgery

## 2023-08-10 VITALS — BP 104/64 | HR 62 | Temp 97.9°F | Resp 18 | Ht 70.0 in | Wt 183.7 lb

## 2023-08-10 DIAGNOSIS — I82402 Acute embolism and thrombosis of unspecified deep veins of left lower extremity: Secondary | ICD-10-CM | POA: Insufficient documentation

## 2023-08-10 DIAGNOSIS — I82432 Acute embolism and thrombosis of left popliteal vein: Secondary | ICD-10-CM | POA: Diagnosis not present

## 2023-08-10 MED ORDER — APIXABAN 5 MG PO TABS: 5.0000 mg | ORAL_TABLET | Freq: Two times a day (BID) | ORAL | 11 refills | Status: AC

## 2024-03-21 ENCOUNTER — Encounter (HOSPITAL_BASED_OUTPATIENT_CLINIC_OR_DEPARTMENT_OTHER): Payer: Self-pay

## 2024-03-21 ENCOUNTER — Emergency Department (HOSPITAL_BASED_OUTPATIENT_CLINIC_OR_DEPARTMENT_OTHER)
Admission: EM | Admit: 2024-03-21 | Discharge: 2024-03-21 | Disposition: A | Discharge status: Home / Self Care | Attending: Emergency Medicine | Admitting: Emergency Medicine

## 2024-03-21 ENCOUNTER — Other Ambulatory Visit: Payer: Self-pay

## 2024-03-21 ENCOUNTER — Emergency Department (HOSPITAL_BASED_OUTPATIENT_CLINIC_OR_DEPARTMENT_OTHER)

## 2024-03-21 DIAGNOSIS — S0990XA Unspecified injury of head, initial encounter: Secondary | ICD-10-CM | POA: Diagnosis present

## 2024-03-21 DIAGNOSIS — W228XXA Striking against or struck by other objects, initial encounter: Secondary | ICD-10-CM | POA: Insufficient documentation

## 2024-03-21 DIAGNOSIS — B029 Zoster without complications: Secondary | ICD-10-CM | POA: Insufficient documentation

## 2024-03-21 DIAGNOSIS — Z7901 Long term (current) use of anticoagulants: Secondary | ICD-10-CM | POA: Diagnosis not present

## 2024-03-21 MED ORDER — OXYCODONE-ACETAMINOPHEN 5-325 MG PO TABS
1.0000 | ORAL_TABLET | Freq: Once | ORAL | Status: AC
  Administered 2024-03-21: 1 via ORAL
  Filled 2024-03-21: qty 1

## 2024-03-21 MED ORDER — VALACYCLOVIR HCL 1 G PO TABS: 1000.0000 mg | ORAL_TABLET | Freq: Two times a day (BID) | ORAL | 0 refills | Status: AC

## 2024-03-21 MED ORDER — OXYCODONE-ACETAMINOPHEN 5-325 MG PO TABS: 1.0000 | ORAL_TABLET | Freq: Four times a day (QID) | ORAL | 0 refills | Status: AC | PRN

## 2024-03-21 MED ORDER — VALACYCLOVIR HCL 500 MG PO TABS
1000.0000 mg | ORAL_TABLET | Freq: Once | ORAL | Status: AC
  Administered 2024-03-21: 1000 mg via ORAL
  Filled 2024-03-21: qty 2

## 2024-03-21 NOTE — ED Provider Notes (Signed)
 [place] [facility] Provider Note   CSN: [id number] Arrival date & time: 03/21/24  1900     Patient presents with: Head Injury   Shawn Cooley is a 73 y.o. male.   Patient to ED for evaluation of headache off and on since hitting his head 5 days ago while on a trip. He was climbing into a van and hit his head on the top cross bar. No LOC, no bleeding wound. He is anticoagulated secondary to DVTs on Eliquis . No nausea, vomiting. He states he has fairly significantly pain over the left side of his head near where he hit that comes and goes. No neck pain.   The history is provided by the patient and the spouse. No language interpreter was used.  Head Injury      Prior to Admission medications   Medication Sig Start Date End Date Taking? Authorizing Provider  oxyCODONE -acetaminophen  (PERCOCET/ROXICET) 5-325 MG tablet Take 1 tablet by mouth every 6 (six) hours as needed for severe pain (pain score 7-10). 03/21/24  Yes Genever Hentges, PA-C  valACYclovir  (VALTREX ) 1000 MG tablet Take 1 tablet (1,000 mg total) by mouth 2 (two) times daily for 14 days. 03/21/24 04/04/24 Yes Boone Gear, Margit, PA-C  albuterol  (PROVENTIL  HFA;VENTOLIN  HFA) 108 (90 Base) MCG/ACT inhaler Inhale 2 puffs into the lungs every 6 (six) hours as needed. 07/30/16   Amon Aloysius BRAVO, MD  allopurinol  (ZYLOPRIM ) 100 MG tablet Take 1 tablet (100 mg total) by mouth daily. 02/15/17   Paz, Jose E, MD  amLODipine -benazepril  (LOTREL) 10-40 MG capsule Take 1 capsule by mouth daily. 04/02/21   [provider]  apixaban  (ELIQUIS ) 5 MG TABS tablet Take 1 tablet (5 mg total) by mouth 2 (two) times daily. 08/10/23   Gretta Lonni PARAS, MD  atorvastatin  (LIPITOR) 20 MG tablet Take 20 mg by mouth daily.    [provider]  colchicine 0.6 MG tablet Take 0.6 mg by mouth daily as needed (gout pain).    [provider]  docusate sodium  (COLACE) 100 MG capsule Take 1 capsule (100 mg  total) by mouth 2 (two) times daily. 04/20/21   Regalado, Belkys A, MD  fluticasone (FLONASE) 50 MCG/ACT nasal spray Place into both nostrils daily.    [provider]  hydrochlorothiazide  (HYDRODIURIL ) 25 MG tablet Take by mouth. 01/03/21   [provider]  mometasone  (ASMANEX ) 220 MCG/INH inhaler Inhale 2 puffs into the lungs daily as needed. 07/30/16   Paz, Jose E, MD  omega-3 acid ethyl esters (LOVAZA) 1 g capsule Take 3 capsules by mouth daily. 11/27/20   [provider]  tamsulosin (FLOMAX) 0.4 MG CAPS capsule Take 0.4 mg by mouth at bedtime.    [provider]    Allergies: Amoxicillin    Review of Systems  Updated Vital Signs BP (!) 150/87 (BP Location: Right Arm)   Pulse 70   Temp 98.2 F (36.8 C) (Oral)   Resp 18   Ht 5' 10 (1.778 m)   Wt 83.9 kg   SpO2 97%   BMI 26.54 kg/m   Physical Exam Constitutional:      Appearance: He is well-developed.  HENT:     Head: Normocephalic and atraumatic.  Eyes:     Pupils: Pupils are equal, round, and reactive to light.  Cardiovascular:     Rate and Rhythm: Normal rate.  Pulmonary:     Effort: Pulmonary effort is normal.  Abdominal:     Palpations: Abdomen  is soft.     Tenderness: There is no abdominal tenderness.  Musculoskeletal:     Cervical back: Normal range of motion.  Skin:    General: Skin is warm and dry.     Comments: Small hematoma to left parietal scalp. There are several raised, slightly red, tender areas to left forehead from above the eyebrow into the border of the scalp.   Neurological:     Mental Status: He is alert and oriented to person, place, and time.     GCS: GCS eye subscore is 4. GCS verbal subscore is 5. GCS motor subscore is 6.     Cranial Nerves: Cranial nerves 2-12 are intact. No cranial nerve deficit, dysarthria or facial asymmetry.     Sensory: No sensory deficit.     Motor: No pronator drift.     Coordination: Finger-Nose-Finger Test normal.     Gait: Gait  normal.     Deep Tendon Reflexes: Reflexes are normal and symmetric.     Reflex Scores:      Patellar reflexes are 2+ on the right side and 2+ on the left side.    (all labs ordered are listed, but only abnormal results are displayed) Labs Reviewed - No data to display  EKG: None  Radiology: CT Head Wo Contrast Result Date: 03/21/2024 CLINICAL DATA:  Blunt trauma to the head several days ago with headaches, initial encounter EXAM: CT HEAD WITHOUT CONTRAST TECHNIQUE: Contiguous axial images were obtained from the base of the skull through the vertex without intravenous contrast. RADIATION DOSE REDUCTION: This exam was performed according to the departmental dose-optimization program which includes automated exposure control, adjustment of the mA and/or kV according to patient size and/or use of iterative reconstruction technique. COMPARISON:  None Available. FINDINGS: Brain: No evidence of acute infarction, hemorrhage, hydrocephalus, extra-axial collection or mass lesion/mass effect. Vascular: No hyperdense vessel or unexpected calcification. Skull: Normal. Negative for fracture or focal lesion. Sinuses/Orbits: No acute finding. Other: Minimal scalp swelling is noted near the vertex on the left consistent the given clinical history IMPRESSION: No acute intracranial abnormality noted. Electronically Signed   By: Oneil Devonshire M.D.   On: 03/21/2024 20:07     Procedures   Medications Ordered in the ED  valACYclovir  (VALTREX ) tablet 1,000 mg (has no administration in time range)  oxyCODONE -acetaminophen  (PERCOCET/ROXICET) 5-325 MG per tablet 1 tablet (has no administration in time range)    Clinical Course as of 03/21/24 2045  Tue Mar 21, 2024  2016 Patient to ED with headache pain since minor head injury 5 days ago. On Eliquis . No neck pain. No nausea. No visual change. He declines pain medication in the ED, prefers to take Tylenol  only. Head CT without any injury. Rash to forehead c/w early  shingles which clinically correlates to symptoms. He has a history of CKD, last Cr 2.34. Will discuss dosing of valacyclovir  with pharmacy [SU]  2038 Recommends dosing valacyclovir  at a reduced dose of 1000 mg BID. Will provide pain medication.  [SU]    Clinical Course User Index [SU] Odell Balls, PA-C                                 Medical Decision Making Amount and/or Complexity of Data Reviewed Radiology: ordered.  Risk Prescription drug management.        Final diagnoses:  Minor head injury, initial encounter  Herpes zoster without complication    ED  Discharge Orders          Ordered    valACYclovir  (VALTREX ) 1000 MG tablet  2 times daily        03/21/24 2042    oxyCODONE -acetaminophen  (PERCOCET/ROXICET) 5-325 MG tablet  Every 6 hours PRN        03/21/24 2042               Odell Balls, PA-C 03/21/24 2045    Yolande Lamar BROCKS, MD 03/21/24 2101

## 2024-03-21 NOTE — Discharge Instructions (Addendum)
 Follow up with your doctor for any worsening symptoms or uncontrolled pain. Return to the ED with any pain that extends to the eye itself, visual change, drainage from the eye.   Take the medications as prescribed. The valacyclovir  is prescribed at a reduced dose as recommended by pharmacy because of your kidney impairment.

## 2024-03-21 NOTE — ED Triage Notes (Addendum)
 Pt states that on Thursday he hit his head on a van. Pt takes Eliquis  2x daily for hx DVTs. No LOC. He now says that he has a big knot on the upper left side of his head. Pt also has headache. Currently no pain, but comes in waves. Denies N/V. Does intermittently feel dizzy.

## 2024-05-25 ENCOUNTER — Other Ambulatory Visit: Payer: Self-pay | Admitting: Urology

## 2024-05-25 DIAGNOSIS — N281 Cyst of kidney, acquired: Secondary | ICD-10-CM

## 2024-05-25 DIAGNOSIS — C61 Malignant neoplasm of prostate: Secondary | ICD-10-CM

## 2024-07-31 ENCOUNTER — Ambulatory Visit
Admission: RE | Admit: 2024-07-31 | Discharge: 2024-07-31 | Disposition: A | Source: Ambulatory Visit | Attending: Urology

## 2024-07-31 DIAGNOSIS — C61 Malignant neoplasm of prostate: Secondary | ICD-10-CM

## 2024-07-31 MED ORDER — GADOPICLENOL 0.5 MMOL/ML IV SOLN
9.0000 mL | Freq: Once | INTRAVENOUS | Status: AC | PRN
  Administered 2024-07-31: 9 mL via INTRAVENOUS

## 2024-08-01 ENCOUNTER — Inpatient Hospital Stay
Admission: RE | Admit: 2024-08-01 | Discharge: 2024-08-01 | Disposition: A | Source: Ambulatory Visit | Attending: Urology

## 2024-08-01 DIAGNOSIS — N281 Cyst of kidney, acquired: Secondary | ICD-10-CM

## 2024-08-01 MED ORDER — GADOPICLENOL 0.5 MMOL/ML IV SOLN
8.0000 mL | Freq: Once | INTRAVENOUS | Status: AC | PRN
  Administered 2024-08-01: 8 mL via INTRAVENOUS
